# Patient Record
Sex: Female | Born: 1975 | Race: White | Hispanic: No | Marital: Married | Smoking: Never smoker
Health system: Southern US, Community
[De-identification: ages and names within clinical notes are randomized; demographics above are authoritative.]

## PROBLEM LIST (undated history)

## (undated) DIAGNOSIS — Z8489 Family history of other specified conditions: Secondary | ICD-10-CM

## (undated) DIAGNOSIS — C801 Malignant (primary) neoplasm, unspecified: Secondary | ICD-10-CM

## (undated) DIAGNOSIS — G43909 Migraine, unspecified, not intractable, without status migrainosus: Secondary | ICD-10-CM

## (undated) DIAGNOSIS — N94810 Vulvar vestibulitis: Secondary | ICD-10-CM

## (undated) DIAGNOSIS — R011 Cardiac murmur, unspecified: Secondary | ICD-10-CM

## (undated) DIAGNOSIS — IMO0001 Reserved for inherently not codable concepts without codable children: Secondary | ICD-10-CM

## (undated) DIAGNOSIS — Z9889 Other specified postprocedural states: Secondary | ICD-10-CM

## (undated) DIAGNOSIS — N761 Subacute and chronic vaginitis: Secondary | ICD-10-CM

## (undated) DIAGNOSIS — R112 Nausea with vomiting, unspecified: Secondary | ICD-10-CM

## (undated) DIAGNOSIS — Z923 Personal history of irradiation: Secondary | ICD-10-CM

## (undated) DIAGNOSIS — IMO0002 Reserved for concepts with insufficient information to code with codable children: Secondary | ICD-10-CM

## (undated) HISTORY — PX: WISDOM TOOTH EXTRACTION: SHX21

## (undated) HISTORY — DX: Reserved for concepts with insufficient information to code with codable children: IMO0002

## (undated) HISTORY — DX: Reserved for inherently not codable concepts without codable children: IMO0001

## (undated) HISTORY — DX: Subacute and chronic vaginitis: N76.1

## (undated) HISTORY — PX: DILATION AND CURETTAGE OF UTERUS: SHX78

## (undated) HISTORY — PX: REDUCTION MAMMAPLASTY: SUR839

## (undated) HISTORY — DX: Vulvar vestibulitis: N94.810

## (undated) HISTORY — DX: Migraine, unspecified, not intractable, without status migrainosus: G43.909

---

## 1998-10-04 ENCOUNTER — Other Ambulatory Visit: Admission: RE | Admit: 1998-10-04 | Discharge: 1998-10-04 | Payer: Self-pay | Admitting: Obstetrics and Gynecology

## 1999-10-10 ENCOUNTER — Other Ambulatory Visit: Admission: RE | Admit: 1999-10-10 | Discharge: 1999-10-10 | Payer: Self-pay | Admitting: Obstetrics and Gynecology

## 2000-10-03 ENCOUNTER — Other Ambulatory Visit: Admission: RE | Admit: 2000-10-03 | Discharge: 2000-10-03 | Payer: Self-pay | Admitting: Obstetrics and Gynecology

## 2001-10-31 ENCOUNTER — Other Ambulatory Visit: Admission: RE | Admit: 2001-10-31 | Discharge: 2001-10-31 | Payer: Self-pay | Admitting: Obstetrics and Gynecology

## 2002-10-22 ENCOUNTER — Ambulatory Visit (HOSPITAL_COMMUNITY): Admission: RE | Admit: 2002-10-22 | Discharge: 2002-10-22 | Payer: Self-pay | Admitting: Obstetrics and Gynecology

## 2002-10-22 ENCOUNTER — Encounter: Payer: Self-pay | Admitting: Obstetrics and Gynecology

## 2002-11-03 ENCOUNTER — Other Ambulatory Visit: Admission: RE | Admit: 2002-11-03 | Discharge: 2002-11-03 | Payer: Self-pay | Admitting: Obstetrics and Gynecology

## 2003-01-10 ENCOUNTER — Ambulatory Visit (HOSPITAL_COMMUNITY): Admission: RE | Admit: 2003-01-10 | Discharge: 2003-01-10 | Payer: Self-pay | Admitting: Obstetrics and Gynecology

## 2004-03-24 ENCOUNTER — Inpatient Hospital Stay (HOSPITAL_COMMUNITY): Admission: AD | Admit: 2004-03-24 | Discharge: 2004-03-26 | Payer: Self-pay | Admitting: Obstetrics and Gynecology

## 2004-05-05 ENCOUNTER — Other Ambulatory Visit: Admission: RE | Admit: 2004-05-05 | Discharge: 2004-05-05 | Payer: Self-pay | Admitting: Obstetrics and Gynecology

## 2005-05-08 ENCOUNTER — Other Ambulatory Visit: Admission: RE | Admit: 2005-05-08 | Discharge: 2005-05-08 | Payer: Self-pay | Admitting: Obstetrics and Gynecology

## 2006-10-07 ENCOUNTER — Inpatient Hospital Stay (HOSPITAL_COMMUNITY): Admission: AD | Admit: 2006-10-07 | Discharge: 2006-10-07 | Payer: Self-pay | Admitting: Obstetrics and Gynecology

## 2006-10-12 ENCOUNTER — Inpatient Hospital Stay (HOSPITAL_COMMUNITY): Admission: RE | Admit: 2006-10-12 | Discharge: 2006-10-14 | Payer: Self-pay | Admitting: Obstetrics and Gynecology

## 2011-10-26 ENCOUNTER — Other Ambulatory Visit: Payer: Self-pay | Admitting: Internal Medicine

## 2011-10-26 DIAGNOSIS — R42 Dizziness and giddiness: Secondary | ICD-10-CM

## 2011-10-26 DIAGNOSIS — R51 Headache: Secondary | ICD-10-CM

## 2011-10-30 ENCOUNTER — Ambulatory Visit
Admission: RE | Admit: 2011-10-30 | Discharge: 2011-10-30 | Disposition: A | Discharge status: Home / Self Care | Payer: BC Managed Care – PPO | Source: Ambulatory Visit | Attending: Internal Medicine | Admitting: Internal Medicine

## 2011-10-30 DIAGNOSIS — R42 Dizziness and giddiness: Secondary | ICD-10-CM

## 2011-10-30 DIAGNOSIS — R51 Headache: Secondary | ICD-10-CM

## 2011-10-30 MED ORDER — GADOBENATE DIMEGLUMINE 529 MG/ML IV SOLN
14.0000 mL | Freq: Once | INTRAVENOUS | Status: AC | PRN
  Administered 2011-10-30: 14 mL via INTRAVENOUS

## 2012-02-29 ENCOUNTER — Encounter: Payer: Self-pay | Admitting: Obstetrics and Gynecology

## 2012-02-29 NOTE — Progress Notes (Signed)
PT WALKED IN REQUESTING A SAMPLE OF NUVARING.  HAS  NOT MISSED ANY RINGS, AEX SCHED FOR WED. 04/10/12 W/ ND @ 0900.

## 2012-04-10 ENCOUNTER — Ambulatory Visit (INDEPENDENT_AMBULATORY_CARE_PROVIDER_SITE_OTHER): Payer: BC Managed Care – PPO | Admitting: Obstetrics and Gynecology

## 2012-04-10 VITALS — BP 108/72 | Ht 69.0 in | Wt 156.0 lb

## 2012-04-10 DIAGNOSIS — Z01419 Encounter for gynecological examination (general) (routine) without abnormal findings: Secondary | ICD-10-CM

## 2012-04-10 DIAGNOSIS — Z3040 Encounter for surveillance of contraceptives, unspecified: Secondary | ICD-10-CM

## 2012-04-10 DIAGNOSIS — Z124 Encounter for screening for malignant neoplasm of cervix: Secondary | ICD-10-CM

## 2012-04-11 LAB — PAP IG W/ RFLX HPV ASCU

## 2012-04-11 NOTE — Progress Notes (Signed)
Last Pap: 03-14-11 WNL: Yes Regular Periods:no Contraception: nuvaring using continually Rx and samples given  Monthly Breast exam:no Tetanus<58yrs:yes Nl.Bladder Function:yes Daily BMs:yes Healthy Diet:no Calcium:no Mammogram:no Date of Mammogram: no Exercise:yes Have often Exercise: 2-3 x week Seatbelt: yes Abuse at home: no Stressful work:no Sigmoid-colonoscopy: no Bone Density: No PCP: Dr Darcus Austin Change in PMH: no Change in FMH:no MRI done for migraines in Feb WNL Next pap 2015

## 2012-08-13 ENCOUNTER — Telehealth: Payer: Self-pay

## 2012-08-13 NOTE — Telephone Encounter (Signed)
Per protocol, I called in refills of Nuvaring to [facility] for pt to use continuously, enough to last til 04/2013. Pt is current with her AEX. Melody Comas A

## 2013-06-17 ENCOUNTER — Other Ambulatory Visit: Payer: Self-pay | Admitting: Physician Assistant

## 2014-09-11 HISTORY — PX: MASTECTOMY: SHX3

## 2014-10-20 ENCOUNTER — Other Ambulatory Visit: Payer: Self-pay

## 2014-10-20 DIAGNOSIS — Z1231 Encounter for screening mammogram for malignant neoplasm of breast: Secondary | ICD-10-CM

## 2014-10-29 ENCOUNTER — Ambulatory Visit
Admission: RE | Admit: 2014-10-29 | Discharge: 2014-10-29 | Disposition: A | Discharge status: Home / Self Care | Payer: BLUE CROSS/BLUE SHIELD | Source: Ambulatory Visit

## 2014-10-29 DIAGNOSIS — Z1231 Encounter for screening mammogram for malignant neoplasm of breast: Secondary | ICD-10-CM

## 2014-10-30 ENCOUNTER — Other Ambulatory Visit: Payer: Self-pay | Admitting: Obstetrics and Gynecology

## 2014-10-30 DIAGNOSIS — N644 Mastodynia: Secondary | ICD-10-CM

## 2014-10-30 DIAGNOSIS — N63 Unspecified lump in unspecified breast: Secondary | ICD-10-CM

## 2014-11-10 ENCOUNTER — Ambulatory Visit
Admission: RE | Admit: 2014-11-10 | Discharge: 2014-11-10 | Disposition: A | Discharge status: Home / Self Care | Payer: BLUE CROSS/BLUE SHIELD | Source: Ambulatory Visit | Attending: Obstetrics and Gynecology | Admitting: Obstetrics and Gynecology

## 2014-11-10 ENCOUNTER — Other Ambulatory Visit: Payer: Self-pay | Admitting: Obstetrics and Gynecology

## 2014-11-10 DIAGNOSIS — N63 Unspecified lump in unspecified breast: Secondary | ICD-10-CM

## 2014-11-10 DIAGNOSIS — N644 Mastodynia: Secondary | ICD-10-CM

## 2014-11-11 ENCOUNTER — Other Ambulatory Visit: Payer: Self-pay | Admitting: Obstetrics and Gynecology

## 2014-11-11 ENCOUNTER — Ambulatory Visit
Admission: RE | Admit: 2014-11-11 | Discharge: 2014-11-11 | Disposition: A | Discharge status: Home / Self Care | Payer: BLUE CROSS/BLUE SHIELD | Source: Ambulatory Visit | Attending: Obstetrics and Gynecology | Admitting: Obstetrics and Gynecology

## 2014-11-11 DIAGNOSIS — N63 Unspecified lump in unspecified breast: Secondary | ICD-10-CM

## 2014-11-11 DIAGNOSIS — C50911 Malignant neoplasm of unspecified site of right female breast: Secondary | ICD-10-CM

## 2014-11-11 DIAGNOSIS — N644 Mastodynia: Secondary | ICD-10-CM

## 2014-11-17 ENCOUNTER — Other Ambulatory Visit: Payer: BLUE CROSS/BLUE SHIELD

## 2014-11-17 ENCOUNTER — Telehealth: Payer: Self-pay | Admitting: *Deleted

## 2014-11-17 NOTE — Telephone Encounter (Signed)
Received call from pt that her MRI was cancelled and she was not informed. Called [facility] to question event. Per [facility], Dr. Berneta Sages office had notified MRI was being cancelled b/c they did not get an authorization. New appt for breast MRI is scheduled for 11/19/14 at 9:15. Confirmed appt with pt. Informed pt that Dr. Berneta Sages office is doing a peer to peer to received authorization. Received verbal understand. Encourage pt to call with needs.

## 2014-11-19 ENCOUNTER — Ambulatory Visit
Admission: RE | Admit: 2014-11-19 | Discharge: 2014-11-19 | Disposition: A | Discharge status: Home / Self Care | Payer: BLUE CROSS/BLUE SHIELD | Source: Ambulatory Visit | Attending: Obstetrics and Gynecology | Admitting: Obstetrics and Gynecology

## 2014-11-19 DIAGNOSIS — C50911 Malignant neoplasm of unspecified site of right female breast: Secondary | ICD-10-CM

## 2014-11-19 MED ORDER — GADOBENATE DIMEGLUMINE 529 MG/ML IV SOLN
15.0000 mL | Freq: Once | INTRAVENOUS | Status: AC | PRN
  Administered 2014-11-19: 15 mL via INTRAVENOUS

## 2014-11-20 ENCOUNTER — Telehealth: Payer: Self-pay | Admitting: *Deleted

## 2014-11-20 ENCOUNTER — Other Ambulatory Visit: Payer: Self-pay | Admitting: General Surgery

## 2014-11-20 ENCOUNTER — Other Ambulatory Visit: Payer: Self-pay | Admitting: *Deleted

## 2014-11-20 DIAGNOSIS — D0510 Intraductal carcinoma in situ of unspecified breast: Secondary | ICD-10-CM | POA: Insufficient documentation

## 2014-11-20 DIAGNOSIS — R928 Other abnormal and inconclusive findings on diagnostic imaging of breast: Secondary | ICD-10-CM

## 2014-11-20 NOTE — Telephone Encounter (Signed)
Scheduled and confirmed new pt appt with Dr. Jana Hakim on 11/23/14 at 4:30 and genetics on 11/26/14 at 3:30PM Pt denies need at this time. Encourage pt to call with questions or concerns.  Received verbal understanding. Contact information given.

## 2014-11-23 ENCOUNTER — Ambulatory Visit: Payer: BLUE CROSS/BLUE SHIELD

## 2014-11-23 ENCOUNTER — Other Ambulatory Visit (HOSPITAL_BASED_OUTPATIENT_CLINIC_OR_DEPARTMENT_OTHER): Payer: BLUE CROSS/BLUE SHIELD

## 2014-11-23 ENCOUNTER — Encounter: Payer: Self-pay | Admitting: Oncology

## 2014-11-23 ENCOUNTER — Ambulatory Visit (HOSPITAL_BASED_OUTPATIENT_CLINIC_OR_DEPARTMENT_OTHER): Payer: BLUE CROSS/BLUE SHIELD | Admitting: Oncology

## 2014-11-23 DIAGNOSIS — G43009 Migraine without aura, not intractable, without status migrainosus: Secondary | ICD-10-CM

## 2014-11-23 DIAGNOSIS — D0500 Lobular carcinoma in situ of unspecified breast: Secondary | ICD-10-CM

## 2014-11-23 DIAGNOSIS — Z170 Estrogen receptor positive status [ER+]: Secondary | ICD-10-CM

## 2014-11-23 DIAGNOSIS — D0511 Intraductal carcinoma in situ of right breast: Secondary | ICD-10-CM

## 2014-11-23 DIAGNOSIS — D0510 Intraductal carcinoma in situ of unspecified breast: Secondary | ICD-10-CM

## 2014-11-23 DIAGNOSIS — G43909 Migraine, unspecified, not intractable, without status migrainosus: Secondary | ICD-10-CM | POA: Insufficient documentation

## 2014-11-23 DIAGNOSIS — C50411 Malignant neoplasm of upper-outer quadrant of right female breast: Secondary | ICD-10-CM | POA: Insufficient documentation

## 2014-11-23 LAB — CBC WITH DIFFERENTIAL/PLATELET
BASO%: 0.7 % (ref 0.0–2.0)
BASOS ABS: 0 10*3/uL (ref 0.0–0.1)
EOS ABS: 0.1 10*3/uL (ref 0.0–0.5)
EOS%: 0.8 % (ref 0.0–7.0)
HEMATOCRIT: 36.9 % (ref 34.8–46.6)
HEMOGLOBIN: 12.7 g/dL (ref 11.6–15.9)
LYMPH#: 2.6 10*3/uL (ref 0.9–3.3)
LYMPH%: 43.2 % (ref 14.0–49.7)
MCH: 31.8 pg (ref 25.1–34.0)
MCHC: 34.4 g/dL (ref 31.5–36.0)
MCV: 92.5 fL (ref 79.5–101.0)
MONO#: 0.4 10*3/uL (ref 0.1–0.9)
MONO%: 5.9 % (ref 0.0–14.0)
NEUT%: 49.4 % (ref 38.4–76.8)
NEUTROS ABS: 3 10*3/uL (ref 1.5–6.5)
Platelets: 229 10*3/uL (ref 145–400)
RBC: 3.99 10*6/uL (ref 3.70–5.45)
RDW: 12 % (ref 11.2–14.5)
WBC: 6.1 10*3/uL (ref 3.9–10.3)

## 2014-11-23 LAB — COMPREHENSIVE METABOLIC PANEL (CC13)
ALBUMIN: 4 g/dL (ref 3.5–5.0)
ALK PHOS: 65 U/L (ref 40–150)
ALT: 13 U/L (ref 0–55)
ANION GAP: 8 meq/L (ref 3–11)
AST: 14 U/L (ref 5–34)
BUN: 9.7 mg/dL (ref 7.0–26.0)
CALCIUM: 9.7 mg/dL (ref 8.4–10.4)
CO2: 27 meq/L (ref 22–29)
Chloride: 106 mEq/L (ref 98–109)
Creatinine: 0.9 mg/dL (ref 0.6–1.1)
EGFR: 78 mL/min/{1.73_m2} — AB (ref >90)
GLUCOSE: 115 mg/dL (ref 70–140)
Potassium: 3.8 mEq/L (ref 3.5–5.1)
SODIUM: 141 meq/L (ref 136–145)
TOTAL PROTEIN: 7.1 g/dL (ref 6.4–8.3)
Total Bilirubin: 0.3 mg/dL (ref 0.20–1.20)

## 2014-11-23 MED ORDER — TAMOXIFEN CITRATE 20 MG PO TABS: 20.0000 mg | ORAL_TABLET | Freq: Every day | ORAL | Status: DC

## 2014-11-23 NOTE — Addendum Note (Signed)
Addended by: Chauncey Cruel on: 11/23/2014 05:52 PM   Modules accepted: Orders, Medications
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
## 2014-11-23 NOTE — Progress Notes (Signed)
Checked in new pt with no financial concerns prior to seeing the dr.  Informed pt if chemo is part of her treatment Raquel will contact her ins to see if Josem Kaufmann is req and will obtain it if it is as well as contact foundations that offer copay assistance for chemo is needed.  She has Raquel's card for any billing questions or concerns.

## 2014-11-24 ENCOUNTER — Other Ambulatory Visit: Payer: Self-pay | Admitting: *Deleted

## 2014-11-24 MED ORDER — TAMOXIFEN CITRATE 20 MG PO TABS: 20.0000 mg | ORAL_TABLET | Freq: Every day | ORAL | Status: DC

## 2014-11-26 ENCOUNTER — Other Ambulatory Visit: Payer: BLUE CROSS/BLUE SHIELD

## 2014-11-26 ENCOUNTER — Ambulatory Visit (HOSPITAL_BASED_OUTPATIENT_CLINIC_OR_DEPARTMENT_OTHER): Payer: BLUE CROSS/BLUE SHIELD | Admitting: Genetic Counselor

## 2014-11-26 DIAGNOSIS — C50919 Malignant neoplasm of unspecified site of unspecified female breast: Secondary | ICD-10-CM | POA: Insufficient documentation

## 2014-11-26 DIAGNOSIS — D0511 Intraductal carcinoma in situ of right breast: Secondary | ICD-10-CM

## 2014-11-26 DIAGNOSIS — Z808 Family history of malignant neoplasm of other organs or systems: Secondary | ICD-10-CM

## 2014-11-26 DIAGNOSIS — C50911 Malignant neoplasm of unspecified site of right female breast: Secondary | ICD-10-CM

## 2014-11-26 DIAGNOSIS — Z315 Encounter for genetic counseling: Secondary | ICD-10-CM

## 2014-11-26 DIAGNOSIS — Z800 Family history of malignant neoplasm of digestive organs: Secondary | ICD-10-CM | POA: Insufficient documentation

## 2014-11-26 NOTE — Progress Notes (Signed)
[facility] Patient Visit  REFERRING PROVIDER: Crist Infante, MD [address] [place], [place] [address]  PRIMARY PROVIDER:  Jerlyn Ly, MD  PRIMARY REASON FOR VISIT:  Personal history of breast cancer  HISTORY OF PRESENT ILLNESS:   Loretta Sanchez, a 39 y.o. female, was seen for a [facility] cancer genetics consultation at the request of Dr. Joylene Draft due to a personal and family history of cancer.  Loretta Sanchez presents to clinic today to discuss the possibility of a hereditary predisposition to cancer, genetic testing, and to further clarify her future cancer risks, as well as potential cancer risks for family members. Loretta Sanchez was accompanied to clinic by her mother, Juliann Pulse.  CANCER HISTORY:  Ms. 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 She is currently in the planning stages of surgery and treatment and would like to use genetic test results to help make a surgical decision. She has no prior history of cancer.   Past Medical History  Diagnosis Date   Chronic vaginitis    Vulvar vestibulitis    Migraines     Past Surgical History  Procedure Laterality Date   Wisdom tooth extraction      History   Social History   Marital Status: Married    Spouse Name: N/A   Number of Children: N/A   Years of Education: N/A   Social History Main Topics   Smoking status: Not on file   Smokeless tobacco: Not on file   Alcohol Use: Not on file   Drug Use: Not on file   Sexual Activity: Not on file   Other Topics Concern   Not on file   Social History Narrative     FAMILY HISTORY:  During the visit, a 4-generation pedigree was obtained. A copy of the pedigree with be scanned into Epic under the Media tab. Significant family history diagnoses include the following: Family History  Problem Relation Age of Onset   Hypertension Mother    Hypertension Father    Hypertension Maternal Grandmother     Cancer Maternal Grandmother 81    primary stomach cancer   Hypertension Maternal Grandfather    Hypertension Paternal Grandmother    Hypertension Paternal Grandfather    Diabetes Paternal Grandfather    Cancer Maternal Uncle 89    primary bone cancer   Cancer Other 17    mat great aunt (MGM sister) with primary stomach cancer   Ms. Blane's ancestry is of Caucasian descent. There is no known Jewish ancestry or consanguinity.  GENETIC COUNSELING ASSESSMENT:  Ms. Harron is a 39 y.o. female with a personal and family history of cancer suggestive of a hereditary predisposition to cancer. We, therefore, discussed and recommended the following at today's visit.   DISCUSSION:  We reviewed the characteristics, features and inheritance patterns of hereditary cancer syndromes. We also discussed genetic testing, including the appropriate family members to test, the process of testing, insurance coverage and turn-around-time for results. We discussed the implications of a negative, positive and/or variant of uncertain significant result. We recommended Ms. Gaspar Garbe pursue genetic testing for the BRCAplus gene panel offered by [facility] first followed by the OvaNext gene panel. The BRCAplus genes panel includes sequencing and rearrangement analysis of the following highly penetrant hereditary cancer genes which can help individuals in breast cancer surgical planning: BRCA1, BRCA2, PALB2, PTEN, TP53 and CDH1. The OvaNext gene panel offered by [facility] includes sequencing and rearrangement analysis  for the following 24 genes (6 of which are tested via the BRCAplus gene panel) :ATM, BARD1, BRCA1, BRCA2, BRIP1, CDH1, CHEK2, EPCAM, MLH1, MRE11A, MSH2, MSH6, MUTYH, NBN, NF1, PALB2, PMS2, PTEN, RAD50, RAD51C, RAD51D, SMARCA4, STK11, and TP53.  PLAN:  Based on our above recommendation, Ms. Goga wished to pursue genetic testing and the blood sample was drawn and will be sent to [facility] for analysis. Results should be available for the BRCAplus gene panel within approximately 2 weeks time, at which point they will be disclosed by telephone to Ms. Wolff, as will any additional recommendations warranted by these results. Reflex testing to the OvaNext gene panel will take an additional 2 weeks and these results and recommendations will also be called out once available.   Based on Ms. McCalls family history of stomach cancer on the maternal side of the family, we also recommended her mother consider genetic counseling and testing and are happy to be of any assistance in coordinating genetic counseling and/or testing for her mother. Lastly, we encouraged Ms. [facility] to remain in contact with cancer genetics annually so that we can continuously update the family history and inform her of any changes in cancer genetics and testing that may be of benefit for this family.   Ms.  Graffius questions were answered to her satisfaction today. Our contact information was provided should additional questions or concerns arise. Thank you for the referral and allowing [place] to share in the care of your patient.   Catherine A. Fine, MS, CGC Certified Psychologist, sport [email or web address] phone: [phone]  The patient was seen for a total of 45 minutes in face-to-face genetic counseling.  This patient was discussed with Dr. Jana Hakim who agrees with the above.    ______________________________________________________________________ For Office Staff:  Number of people involved in session including genetic counselor: 3 Was an intern or student involved with case: not applicable

## 2014-11-27 ENCOUNTER — Other Ambulatory Visit: Payer: BLUE CROSS/BLUE SHIELD

## 2014-11-27 ENCOUNTER — Telehealth: Payer: Self-pay | Admitting: *Deleted

## 2014-11-27 NOTE — Telephone Encounter (Signed)
This RN returned call to pt per her call placed in VM of Loretta Sanchez. Message was inquiring about " what type of birth control did he want me to ask my GYN about "  This RN reviewed pt's dictation and left information of " mirena" on identified VM.

## 2014-11-30 ENCOUNTER — Other Ambulatory Visit: Payer: Self-pay | Admitting: General Surgery

## 2014-11-30 ENCOUNTER — Telehealth: Payer: Self-pay | Admitting: *Deleted

## 2014-11-30 DIAGNOSIS — D0511 Intraductal carcinoma in situ of right breast: Secondary | ICD-10-CM

## 2014-11-30 NOTE — Telephone Encounter (Signed)
Pt called asking which IUD to use- Mirena or Paragard. Recommend Paragard d/t no hormone in this IUD. Received verbal understanding.

## 2014-12-09 ENCOUNTER — Encounter: Payer: Self-pay | Admitting: Genetic Counselor

## 2014-12-09 DIAGNOSIS — Z800 Family history of malignant neoplasm of digestive organs: Secondary | ICD-10-CM

## 2014-12-09 DIAGNOSIS — C50911 Malignant neoplasm of unspecified site of right female breast: Secondary | ICD-10-CM

## 2014-12-09 NOTE — Progress Notes (Signed)
Loretta Sanchez recently had cancer genetic counseling at [facility] on 11/26/2014. At that time, it was recommended she pursue genetic testing for the BRCAPlus gene panel at [facility], which looks for mutations in the BRCA1, BRCA2, PALB2, CDH1, PTEN and TP53 genes . Her gene panel, which was performed at [facility], has returned and is negative for mutations in these 6 genes. These results were disclosed to her today. Per her request, reflex testing for the OvaNext gene panel at [facility] was initiated. The OvaNext gene panel offered by [employer] includes sequencing and rearrangement analysis for the following 24 genes (6 of which have been completed as mentioned) :ATM, BARD1, BRCA1, BRCA2, BRIP1, CDH1, CHEK2, EPCAM, MLH1, MRE11A, MSH2, MSH6, MUTYH, NBN, NF1, PALB2, PMS2, PTEN, RAD50, RAD51C, RAD51D, SMARCA4, STK11, and TP53. Results for the OvaNext gene panel should be available in 2-3 more weeks and we will contact her to discuss these results and recommendations warranted by these results.

## 2014-12-10 ENCOUNTER — Other Ambulatory Visit (HOSPITAL_COMMUNITY): Payer: Self-pay | Admitting: *Deleted

## 2014-12-10 ENCOUNTER — Other Ambulatory Visit (HOSPITAL_COMMUNITY): Payer: BLUE CROSS/BLUE SHIELD

## 2014-12-10 ENCOUNTER — Encounter (HOSPITAL_COMMUNITY): Payer: Self-pay

## 2014-12-10 ENCOUNTER — Encounter (HOSPITAL_COMMUNITY)
Admission: RE | Admit: 2014-12-10 | Discharge: 2014-12-10 | Disposition: A | Discharge status: Home / Self Care | Payer: BLUE CROSS/BLUE SHIELD | Source: Ambulatory Visit | Attending: General Surgery | Admitting: General Surgery

## 2014-12-10 DIAGNOSIS — Z01812 Encounter for preprocedural laboratory examination: Secondary | ICD-10-CM | POA: Insufficient documentation

## 2014-12-10 HISTORY — DX: Malignant (primary) neoplasm, unspecified: C80.1

## 2014-12-10 HISTORY — DX: Cardiac murmur, unspecified: R01.1

## 2014-12-10 HISTORY — DX: Family history of other specified conditions: Z84.89

## 2014-12-10 LAB — CBC WITH DIFFERENTIAL/PLATELET
BASOS ABS: 0 10*3/uL (ref 0.0–0.1)
BASOS PCT: 0 % (ref 0–1)
Eosinophils Absolute: 0.1 10*3/uL (ref 0.0–0.7)
Eosinophils Relative: 1 % (ref 0–5)
HEMATOCRIT: 37.2 % (ref 36.0–46.0)
Hemoglobin: 12.7 g/dL (ref 12.0–15.0)
Lymphocytes Relative: 35 % (ref 12–46)
Lymphs Abs: 2 10*3/uL (ref 0.7–4.0)
MCH: 31.7 pg (ref 26.0–34.0)
MCHC: 34.1 g/dL (ref 30.0–36.0)
MCV: 92.8 fL (ref 78.0–100.0)
MONOS PCT: 7 % (ref 3–12)
Monocytes Absolute: 0.4 10*3/uL (ref 0.1–1.0)
NEUTROS ABS: 3.3 10*3/uL (ref 1.7–7.7)
Neutrophils Relative %: 57 % (ref 43–77)
Platelets: 215 10*3/uL (ref 150–400)
RBC: 4.01 MIL/uL (ref 3.87–5.11)
RDW: 12.1 % (ref 11.5–15.5)
WBC: 5.8 10*3/uL (ref 4.0–10.5)

## 2014-12-10 LAB — BASIC METABOLIC PANEL
ANION GAP: 8 (ref 5–15)
BUN: 9 mg/dL (ref 6–23)
CHLORIDE: 104 mmol/L (ref 96–112)
CO2: 27 mmol/L (ref 19–32)
Calcium: 9.6 mg/dL (ref 8.4–10.5)
Creatinine, Ser: 0.89 mg/dL (ref 0.50–1.10)
GFR calc non Af Amer: 79 mL/min — ABNORMAL LOW (ref >90)
Glucose, Bld: 114 mg/dL — ABNORMAL HIGH (ref 70–99)
Potassium: 3.8 mmol/L (ref 3.5–5.1)
SODIUM: 139 mmol/L (ref 135–145)

## 2014-12-10 NOTE — Pre-Procedure Instructions (Signed)
Loretta Sanchez  12/10/2014   Your procedure is scheduled on:  Monday, December 21, 2014 at 7:30 AM.   Report to [facility] Entrance "A" Admitting Office at 5:30 AM.   Call this number if you have problems the morning of surgery: [phone]               Any questions prior to day of surgery, please call [phone] between 8 & 4 PM.    Remember:   Do not eat food or drink liquids after midnight Sunday, 12/20/14.   Take these medicines the morning of surgery with A SIP OF WATER: Tamoxifen (Nolvadex), Cetirizine (Zyrtec) - if needed.  Stop Vitamins 7 days prior to surgery.   Do not wear jewelry, make-up or nail polish.  Do not wear lotions, powders, or perfumes. You may NOT wear deodorant.  Do not shave 48 hours prior to surgery.   Do not bring valuables to the hospital.  [facility] is not responsible                  for any belongings or valuables.               Contacts, dentures or bridgework may not be worn into surgery.  Leave suitcase in the car. After surgery it may be brought to your room.  For patients admitted to the hospital, discharge time is determined by your                treatment team.               Special Instructions: [name] - Preparing for Surgery  Before surgery, you can play an important role.  Because skin is not sterile, your skin needs to be as free of germs as possible.  You can reduce the number of germs on you skin by washing with CHG (chlorahexidine gluconate) soap before surgery.  CHG is an antiseptic cleaner which kills germs and bonds with the skin to continue killing germs even after washing.  Please DO NOT use if you have an allergy to CHG or antibacterial soaps.  If your skin becomes reddened/irritated stop using the CHG and inform your nurse when you arrive at Short Stay.  Do not shave (including legs and underarms) for at least 48 hours prior to the first CHG shower.  You may shave your face.  Please follow these instructions  carefully:   1.  Shower with CHG Soap the night before surgery and the                                morning of Surgery.  2.  If you choose to wash your hair, wash your hair first as usual with your       normal shampoo.  3.  After you shampoo, rinse your hair and body thoroughly to remove the                      Shampoo.  4.  Use CHG as you would any other liquid soap.  You can apply chg directly       to the skin and wash gently with scrungie or a clean washcloth.  5.  Apply the CHG Soap to your body ONLY FROM THE NECK DOWN.        Do not use on open wounds or open sores.  Avoid contact with your  eyes, ears, mouth and genitals (private parts).  Wash genitals (private parts) with your normal soap.  6.  Wash thoroughly, paying special attention to the area where your surgery        will be performed.  7.  Thoroughly rinse your body with warm water from the neck down.  8.  DO NOT shower/wash with your normal soap after using and rinsing off       the CHG Soap.  9.  Pat yourself dry with a clean towel.            10.  Wear clean pajamas.            11.  Place clean sheets on your bed the night of your first shower and do not        sleep with pets.  Day of Surgery  Do not apply any lotions/deodorants the morning of surgery.  Please wear clean clothes to the hospital.    Please read over the following fact sheets that you were given: Pain Booklet, Coughing and Deep Breathing and Surgical Site Infection Prevention

## 2014-12-10 NOTE — Progress Notes (Signed)
Pt stated that Dr. Iran Planas is doing breast reconstruction after Dr. Donne Hazel does the mastectomy. No orders in EPIC from Dr. Iran Planas. Called Dr. Para Skeans office to request orders, left message on nurse line.

## 2014-12-17 NOTE — H&P (Deleted)
  Subjective:    Patient ID: Loretta Sanchez is a 39 y.o. female.  HPI  Returns for follow up discussion breast reconstruction; plan for mastectomy with immediate expander/ADM. Presented with pain in the UOQ of right breast and had a brownish discharge from the right nipple. Underwent bilateral diagnostic MMG and right breast US. The breast density was category C. In the 9 o'clock area of the right breast there were numerous microcalcifications, extending over an area of 7 cm. There were calcifications in the retroareolar area as well in the right breast. US showed focally dilated ducts in the 9:00 position of the breast. The dilated ducts contained some internal echogenic soft tissues and spanned at least 7 cm. Ultrasound of the right axilla was unremarkable as was the left breast. 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 MRI showed an area of confluent non-masslike enhancement throughout the outer right breast extending over 9 cm. There were no abnormal appearing lymph nodes, left breast was unremarkable. Genetics panel negative so far. Has started tamoxifen as she awaits surgery.  Current 38 B, happy with this. Wt stable. Reports still has pain on right.  Review of Systems 12 point review negative    Objective:    Physical Exam  Cardiovascular: Normal rate, regular rhythm and normal heart sounds.  Pulmonary/Chest: Effort normal and breath sounds normal.  Genitourinary: No breast discharge.  Grade 1 ptosis on left, pseudoptosis on right SN to nipple R 24 L 25 cm BW R 16 L 15 Nipple to IMF R 8 L 9 cm  Induration post biopsy right  Skin:  Rodney Booze 2       Assessment:      R breast DCIS     Plan:      Plan immediate expander placement. She has been counseled not a candidate for NSM given imaging with disease in area of areola. Reviewed process of expansion, timing of secondary and symmetry surgeries. Discussed use of acellular dermal  matrix. Discussed implant based risks inc rupture, infection, extrusion, rotation, contracture. Reviewed hospital stay, incisions, drains, post procedure limitations. Discussed risks including but not limited to bleeding, infection, seroma, hematoma, need for additional surgery, damage to deeper structures, DVT/PE, cardiopulmonary complications, unacceptable cosmetic result.    15 minutes spent with pt over half in counseling.      Irene Limbo, MD [facility] Plastic & Reconstructive Surgery [phone]

## 2014-12-20 MED ORDER — CEFAZOLIN SODIUM-DEXTROSE 2-3 GM-% IV SOLR
2.0000 g | INTRAVENOUS | Status: AC
  Administered 2014-12-21: 2 g via INTRAVENOUS
  Filled 2014-12-20: qty 50

## 2014-12-21 ENCOUNTER — Encounter (HOSPITAL_COMMUNITY)
Admission: RE | Admit: 2014-12-21 | Discharge: 2014-12-21 | Disposition: A | Discharge status: Home / Self Care | Payer: BLUE CROSS/BLUE SHIELD | Source: Ambulatory Visit | Attending: General Surgery | Admitting: General Surgery

## 2014-12-21 ENCOUNTER — Ambulatory Visit (HOSPITAL_COMMUNITY): Payer: BLUE CROSS/BLUE SHIELD | Admitting: Anesthesiology

## 2014-12-21 ENCOUNTER — Encounter (HOSPITAL_COMMUNITY)
Admission: RE | Disposition: A | Discharge status: Home / Self Care | Payer: Self-pay | Source: Ambulatory Visit | Attending: General Surgery

## 2014-12-21 ENCOUNTER — Observation Stay (HOSPITAL_COMMUNITY)
Admission: RE | Admit: 2014-12-21 | Discharge: 2014-12-22 | Disposition: A | Discharge status: Home / Self Care | Payer: BLUE CROSS/BLUE SHIELD | Source: Ambulatory Visit | Attending: General Surgery | Admitting: General Surgery

## 2014-12-21 ENCOUNTER — Encounter (HOSPITAL_COMMUNITY): Payer: Self-pay | Admitting: *Deleted

## 2014-12-21 DIAGNOSIS — D0510 Intraductal carcinoma in situ of unspecified breast: Secondary | ICD-10-CM | POA: Diagnosis present

## 2014-12-21 DIAGNOSIS — C50911 Malignant neoplasm of unspecified site of right female breast: Secondary | ICD-10-CM | POA: Diagnosis not present

## 2014-12-21 DIAGNOSIS — D0511 Intraductal carcinoma in situ of right breast: Secondary | ICD-10-CM

## 2014-12-21 DIAGNOSIS — G43909 Migraine, unspecified, not intractable, without status migrainosus: Secondary | ICD-10-CM | POA: Diagnosis not present

## 2014-12-21 HISTORY — PX: SIMPLE MASTECTOMY WITH AXILLARY SENTINEL NODE BIOPSY: SHX6098

## 2014-12-21 HISTORY — PX: BREAST RECONSTRUCTION WITH PLACEMENT OF TISSUE EXPANDER AND FLEX HD (ACELLULAR HYDRATED DERMIS): SHX6295

## 2014-12-21 SURGERY — SIMPLE MASTECTOMY WITH AXILLARY SENTINEL NODE BIOPSY
Anesthesia: Regional | Site: Breast | Laterality: Right

## 2014-12-21 MED ORDER — PROMETHAZINE HCL 25 MG/ML IJ SOLN
INTRAMUSCULAR | Status: AC
  Filled 2014-12-21: qty 1

## 2014-12-21 MED ORDER — BUPIVACAINE-EPINEPHRINE (PF) 0.5% -1:200000 IJ SOLN
INTRAMUSCULAR | Status: DC | PRN
  Administered 2014-12-21: 30 mL

## 2014-12-21 MED ORDER — TECHNETIUM TC 99M SULFUR COLLOID FILTERED
1.0000 | Freq: Once | INTRAVENOUS | Status: AC | PRN
  Administered 2014-12-21: 1 via INTRADERMAL

## 2014-12-21 MED ORDER — LIDOCAINE HCL (CARDIAC) 20 MG/ML IV SOLN
INTRAVENOUS | Status: AC
  Filled 2014-12-21: qty 10

## 2014-12-21 MED ORDER — ONDANSETRON HCL 4 MG PO TABS: 4.0000 mg | ORAL_TABLET | Freq: Four times a day (QID) | ORAL | Status: DC | PRN

## 2014-12-21 MED ORDER — OXYCODONE-ACETAMINOPHEN 5-325 MG PO TABS: 1.0000 | ORAL_TABLET | ORAL | Status: DC | PRN

## 2014-12-21 MED ORDER — HYDROMORPHONE HCL 1 MG/ML IJ SOLN
0.2500 mg | INTRAMUSCULAR | Status: DC | PRN
  Administered 2014-12-21 (×4): 0.5 mg via INTRAVENOUS

## 2014-12-21 MED ORDER — 0.9 % SODIUM CHLORIDE (POUR BTL) OPTIME
TOPICAL | Status: DC | PRN
Start: 2014-12-21 — End: 2014-12-21
  Administered 2014-12-21 (×2): 1000 mL

## 2014-12-21 MED ORDER — FENTANYL CITRATE 0.05 MG/ML IJ SOLN
INTRAMUSCULAR | Status: DC | PRN
  Administered 2014-12-21 (×5): 50 ug via INTRAVENOUS

## 2014-12-21 MED ORDER — PHENYLEPHRINE 40 MCG/ML (10ML) SYRINGE FOR IV PUSH (FOR BLOOD PRESSURE SUPPORT)
PREFILLED_SYRINGE | INTRAVENOUS | Status: AC
  Filled 2014-12-21: qty 10

## 2014-12-21 MED ORDER — ENOXAPARIN SODIUM 40 MG/0.4ML ~~LOC~~ SOLN
40.0000 mg | SUBCUTANEOUS | Status: DC
Start: 2014-12-22 — End: 2014-12-22
  Administered 2014-12-22: 40 mg via SUBCUTANEOUS
  Filled 2014-12-21: qty 0.4

## 2014-12-21 MED ORDER — CEFAZOLIN SODIUM 1-5 GM-% IV SOLN
1.0000 g | Freq: Three times a day (TID) | INTRAVENOUS | Status: AC
  Administered 2014-12-21 – 2014-12-22 (×3): 1 g via INTRAVENOUS
  Filled 2014-12-21 (×3): qty 50

## 2014-12-21 MED ORDER — OXYCODONE HCL 5 MG PO TABS: 5.0000 mg | ORAL_TABLET | Freq: Once | ORAL | Status: DC | PRN

## 2014-12-21 MED ORDER — SODIUM CHLORIDE 0.9 % IJ SOLN
INTRAMUSCULAR | Status: AC
  Filled 2014-12-21: qty 10

## 2014-12-21 MED ORDER — PROMETHAZINE HCL 25 MG/ML IJ SOLN
6.2500 mg | INTRAMUSCULAR | Status: DC | PRN
  Administered 2014-12-21: 6.25 mg via INTRAVENOUS

## 2014-12-21 MED ORDER — PROPOFOL 10 MG/ML IV BOLUS
INTRAVENOUS | Status: AC
  Filled 2014-12-21: qty 20

## 2014-12-21 MED ORDER — ONDANSETRON HCL 4 MG/2ML IJ SOLN
INTRAMUSCULAR | Status: AC
  Filled 2014-12-21: qty 2

## 2014-12-21 MED ORDER — MIDAZOLAM HCL 5 MG/5ML IJ SOLN
INTRAMUSCULAR | Status: DC | PRN
  Administered 2014-12-21: 2 mg via INTRAVENOUS

## 2014-12-21 MED ORDER — ONDANSETRON HCL 4 MG/2ML IJ SOLN
INTRAMUSCULAR | Status: DC | PRN
  Administered 2014-12-21 (×2): 4 mg via INTRAVENOUS

## 2014-12-21 MED ORDER — LACTATED RINGERS IV SOLN
INTRAVENOUS | Status: DC | PRN
  Administered 2014-12-21 (×2): via INTRAVENOUS

## 2014-12-21 MED ORDER — TAMOXIFEN CITRATE 10 MG PO TABS
20.0000 mg | ORAL_TABLET | Freq: Every day | ORAL | Status: DC
  Filled 2014-12-21 (×2): qty 2

## 2014-12-21 MED ORDER — LIDOCAINE HCL (CARDIAC) 20 MG/ML IV SOLN
INTRAVENOUS | Status: AC
  Filled 2014-12-21: qty 5

## 2014-12-21 MED ORDER — ROCURONIUM BROMIDE 100 MG/10ML IV SOLN
INTRAVENOUS | Status: DC | PRN
  Administered 2014-12-21: 40 mg via INTRAVENOUS
  Administered 2014-12-21: 10 mg via INTRAVENOUS

## 2014-12-21 MED ORDER — SCOPOLAMINE 1 MG/3DAYS TD PT72
MEDICATED_PATCH | TRANSDERMAL | Status: DC | PRN
  Administered 2014-12-21: 1 via TRANSDERMAL

## 2014-12-21 MED ORDER — OXYCODONE-ACETAMINOPHEN 5-325 MG PO TABS
1.0000 | ORAL_TABLET | ORAL | Status: DC | PRN
  Administered 2014-12-21: 2 via ORAL
  Administered 2014-12-21: 1 via ORAL
  Administered 2014-12-21 – 2014-12-22 (×3): 2 via ORAL
  Filled 2014-12-21 (×4): qty 2
  Filled 2014-12-21: qty 1

## 2014-12-21 MED ORDER — GENTAMICIN SULFATE 40 MG/ML IJ SOLN
Freq: Once | INTRAMUSCULAR | Status: AC
  Administered 2014-12-21: 1000 mL
  Filled 2014-12-21: qty 1

## 2014-12-21 MED ORDER — GLYCOPYRROLATE 0.2 MG/ML IJ SOLN
INTRAMUSCULAR | Status: DC | PRN
  Administered 2014-12-21: 0.4 mg via INTRAVENOUS

## 2014-12-21 MED ORDER — DEXAMETHASONE SODIUM PHOSPHATE 4 MG/ML IJ SOLN
INTRAMUSCULAR | Status: AC
  Filled 2014-12-21: qty 1

## 2014-12-21 MED ORDER — KETOROLAC TROMETHAMINE 15 MG/ML IJ SOLN
15.0000 mg | Freq: Three times a day (TID) | INTRAMUSCULAR | Status: DC
  Administered 2014-12-21 – 2014-12-22 (×3): 15 mg via INTRAVENOUS
  Filled 2014-12-21 (×3): qty 1

## 2014-12-21 MED ORDER — SCOPOLAMINE 1 MG/3DAYS TD PT72
MEDICATED_PATCH | TRANSDERMAL | Status: AC
  Filled 2014-12-21: qty 1

## 2014-12-21 MED ORDER — HYDROMORPHONE HCL 1 MG/ML IJ SOLN
INTRAMUSCULAR | Status: AC
  Filled 2014-12-21: qty 1

## 2014-12-21 MED ORDER — DIAZEPAM 5 MG PO TABS: 5.0000 mg | ORAL_TABLET | Freq: Three times a day (TID) | ORAL | Status: DC | PRN

## 2014-12-21 MED ORDER — NEOSTIGMINE METHYLSULFATE 10 MG/10ML IV SOLN
INTRAVENOUS | Status: DC | PRN
  Administered 2014-12-21: 2.5 mg via INTRAVENOUS

## 2014-12-21 MED ORDER — PROPOFOL 10 MG/ML IV BOLUS
INTRAVENOUS | Status: DC | PRN
  Administered 2014-12-21: 170 mg via INTRAVENOUS

## 2014-12-21 MED ORDER — METHYLENE BLUE 1 % INJ SOLN
INTRAMUSCULAR | Status: AC
  Filled 2014-12-21: qty 10

## 2014-12-21 MED ORDER — ROCURONIUM BROMIDE 50 MG/5ML IV SOLN
INTRAVENOUS | Status: AC
  Filled 2014-12-21: qty 1

## 2014-12-21 MED ORDER — KCL IN DEXTROSE-NACL 20-5-0.45 MEQ/L-%-% IV SOLN
INTRAVENOUS | Status: DC
Start: 2014-12-21 — End: 2014-12-22
  Administered 2014-12-21: 17:00:00 via INTRAVENOUS
  Filled 2014-12-21 (×3): qty 1000

## 2014-12-21 MED ORDER — ONDANSETRON HCL 4 MG/2ML IJ SOLN: 4.0000 mg | Freq: Four times a day (QID) | INTRAMUSCULAR | Status: DC | PRN

## 2014-12-21 MED ORDER — HYDROMORPHONE HCL 1 MG/ML IJ SOLN: 0.5000 mg | INTRAMUSCULAR | Status: DC | PRN

## 2014-12-21 MED ORDER — EPHEDRINE SULFATE 50 MG/ML IJ SOLN
INTRAMUSCULAR | Status: AC
  Filled 2014-12-21: qty 1

## 2014-12-21 MED ORDER — DEXAMETHASONE SODIUM PHOSPHATE 4 MG/ML IJ SOLN
INTRAMUSCULAR | Status: DC | PRN
  Administered 2014-12-21: 4 mg via INTRAVENOUS

## 2014-12-21 MED ORDER — SUCCINYLCHOLINE CHLORIDE 20 MG/ML IJ SOLN
INTRAMUSCULAR | Status: AC
  Filled 2014-12-21: qty 2

## 2014-12-21 MED ORDER — PHENYLEPHRINE HCL 10 MG/ML IJ SOLN
10.0000 mg | INTRAMUSCULAR | Status: DC | PRN
  Administered 2014-12-21: 10 ug/min via INTRAVENOUS

## 2014-12-21 MED ORDER — KETOROLAC TROMETHAMINE 30 MG/ML IJ SOLN
30.0000 mg | Freq: Once | INTRAMUSCULAR | Status: AC | PRN
  Administered 2014-12-21: 30 mg via INTRAVENOUS

## 2014-12-21 MED ORDER — ENOXAPARIN SODIUM 40 MG/0.4ML ~~LOC~~ SOLN: 40.0000 mg | SUBCUTANEOUS | Status: DC

## 2014-12-21 MED ORDER — DIPHENHYDRAMINE HCL 12.5 MG/5ML PO ELIX
12.5000 mg | ORAL_SOLUTION | Freq: Four times a day (QID) | ORAL | Status: DC | PRN
  Administered 2014-12-21: 12.5 mg via ORAL
  Filled 2014-12-21: qty 10

## 2014-12-21 MED ORDER — OXYCODONE HCL 5 MG/5ML PO SOLN: 5.0000 mg | Freq: Once | ORAL | Status: DC | PRN

## 2014-12-21 MED ORDER — PHENYLEPHRINE HCL 10 MG/ML IJ SOLN
INTRAMUSCULAR | Status: DC | PRN
  Administered 2014-12-21: 80 ug via INTRAVENOUS

## 2014-12-21 MED ORDER — MENTHOL 3 MG MT LOZG
1.0000 | LOZENGE | OROMUCOSAL | Status: DC | PRN
  Filled 2014-12-21: qty 9

## 2014-12-21 MED ORDER — SULFAMETHOXAZOLE-TRIMETHOPRIM 800-160 MG PO TABS: 1.0000 | ORAL_TABLET | Freq: Two times a day (BID) | ORAL | Status: DC

## 2014-12-21 MED ORDER — KETOROLAC TROMETHAMINE 30 MG/ML IJ SOLN
INTRAMUSCULAR | Status: AC
  Filled 2014-12-21: qty 1

## 2014-12-21 MED ORDER — MIDAZOLAM HCL 2 MG/2ML IJ SOLN
INTRAMUSCULAR | Status: AC
  Filled 2014-12-21: qty 2

## 2014-12-21 MED ORDER — GLYCOPYRROLATE 0.2 MG/ML IJ SOLN
INTRAMUSCULAR | Status: AC
  Filled 2014-12-21: qty 2

## 2014-12-21 MED ORDER — TAMOXIFEN CITRATE 20 MG PO TABS: 20.0000 mg | ORAL_TABLET | Freq: Every day | ORAL | Status: DC

## 2014-12-21 MED ORDER — FENTANYL CITRATE 0.05 MG/ML IJ SOLN
INTRAMUSCULAR | Status: AC
  Filled 2014-12-21: qty 5

## 2014-12-21 MED ORDER — LIDOCAINE HCL (CARDIAC) 20 MG/ML IV SOLN
INTRAVENOUS | Status: DC | PRN
  Administered 2014-12-21: 60 mg via INTRAVENOUS

## 2014-12-21 SURGICAL SUPPLY — 78 items
APPLIER CLIP 9.375 MED OPEN (MISCELLANEOUS) ×2
BAG DECANTER FOR FLEXI CONT (MISCELLANEOUS) ×2 IMPLANT
BINDER BREAST LRG (GAUZE/BANDAGES/DRESSINGS) IMPLANT
BINDER BREAST XLRG (GAUZE/BANDAGES/DRESSINGS) IMPLANT
BIOPATCH RED 1 DISK 7.0 (GAUZE/BANDAGES/DRESSINGS) IMPLANT
BLADE SURG 10 STRL SS (BLADE) IMPLANT
CANISTER SUCTION 2500CC (MISCELLANEOUS) ×4 IMPLANT
CHLORAPREP W/TINT 26ML (MISCELLANEOUS) ×2 IMPLANT
CLIP APPLIE 9.375 MED OPEN (MISCELLANEOUS) ×1 IMPLANT
CONT SPEC 4OZ CLIKSEAL STRL BL (MISCELLANEOUS) ×2 IMPLANT
COVER PROBE W GEL 5X96 (DRAPES) ×2 IMPLANT
COVER SURGICAL LIGHT HANDLE (MISCELLANEOUS) ×2 IMPLANT
DRAIN CHANNEL 15F RND FF W/TCR (WOUND CARE) IMPLANT
DRAIN CHANNEL 19F RND (DRAIN) ×4 IMPLANT
DRAPE CHEST BREAST 15X10 FENES (DRAPES) IMPLANT
DRAPE ORTHO SPLIT 77X108 STRL (DRAPES) ×2
DRAPE PROXIMA HALF (DRAPES) ×2 IMPLANT
DRAPE SURG ORHT 6 SPLT 77X108 (DRAPES) ×2 IMPLANT
DRAPE WARM FLUID 44X44 (DRAPE) ×2 IMPLANT
DRSG PAD ABDOMINAL 8X10 ST (GAUZE/BANDAGES/DRESSINGS) ×4 IMPLANT
DRSG TEGADERM 2-3/8X2-3/4 SM (GAUZE/BANDAGES/DRESSINGS) ×2 IMPLANT
DRSG TEGADERM 4X4.75 (GAUZE/BANDAGES/DRESSINGS) ×2 IMPLANT
ELECT BLADE 4.0 EZ CLEAN MEGAD (MISCELLANEOUS) ×2
ELECT CAUTERY BLADE 6.4 (BLADE) ×2 IMPLANT
ELECT REM PT RETURN 9FT ADLT (ELECTROSURGICAL) ×4
ELECTRODE BLDE 4.0 EZ CLN MEGD (MISCELLANEOUS) ×1 IMPLANT
ELECTRODE REM PT RTRN 9FT ADLT (ELECTROSURGICAL) ×2 IMPLANT
EVACUATOR SILICONE 100CC (DRAIN) ×4 IMPLANT
GAUZE SPONGE 4X4 12PLY STRL (GAUZE/BANDAGES/DRESSINGS) IMPLANT
GLOVE BIO SURGEON STRL SZ 6 (GLOVE) ×4 IMPLANT
GLOVE BIO SURGEON STRL SZ7 (GLOVE) ×4 IMPLANT
GLOVE BIOGEL PI IND STRL 6 (GLOVE) ×1 IMPLANT
GLOVE BIOGEL PI IND STRL 7.0 (GLOVE) ×3 IMPLANT
GLOVE BIOGEL PI IND STRL 7.5 (GLOVE) ×1 IMPLANT
GLOVE BIOGEL PI INDICATOR 6 (GLOVE) ×1
GLOVE BIOGEL PI INDICATOR 7.0 (GLOVE) ×3
GLOVE BIOGEL PI INDICATOR 7.5 (GLOVE) ×1
GLOVE SURG SS PI 6.0 STRL IVOR (GLOVE) ×2 IMPLANT
GLOVE SURG SS PI 7.0 STRL IVOR (GLOVE) ×4 IMPLANT
GOWN STRL REUS W/ TWL LRG LVL3 (GOWN DISPOSABLE) ×3 IMPLANT
GOWN STRL REUS W/TWL LRG LVL3 (GOWN DISPOSABLE) ×3
GRAFT FLEX HD 4X16 THICK (Tissue Mesh) ×2 IMPLANT
IMPL BREAST ARTOURA 375CC (Breast) ×1 IMPLANT
IMPLANT BREAST ARTOURA 375CC (Breast) ×2 IMPLANT
KIT BASIN OR (CUSTOM PROCEDURE TRAY) ×2 IMPLANT
KIT ROOM TURNOVER OR (KITS) ×2 IMPLANT
LIQUID BAND (GAUZE/BANDAGES/DRESSINGS) IMPLANT
MARKER SKIN DUAL TIP RULER LAB (MISCELLANEOUS) ×2 IMPLANT
NEEDLE 18GX1X1/2 (RX/OR ONLY) (NEEDLE) IMPLANT
NEEDLE HYPO 25GX1X1/2 BEV (NEEDLE) IMPLANT
NS IRRIG 1000ML POUR BTL (IV SOLUTION) ×4 IMPLANT
PACK GENERAL/GYN (CUSTOM PROCEDURE TRAY) ×2 IMPLANT
PAD ARMBOARD 7.5X6 YLW CONV (MISCELLANEOUS) ×2 IMPLANT
PIN SAFETY STERILE (MISCELLANEOUS) ×2 IMPLANT
SET ASEPTIC TRANSFER (MISCELLANEOUS) ×2 IMPLANT
SOLUTION BETADINE 4OZ (MISCELLANEOUS) IMPLANT
SPECIMEN JAR X LARGE (MISCELLANEOUS) ×2 IMPLANT
SPONGE LAP 18X18 X RAY DECT (DISPOSABLE) ×4 IMPLANT
STAPLER VISISTAT 35W (STAPLE) ×2 IMPLANT
SUT CHROMIC 5 0 P 3 (SUTURE) IMPLANT
SUT ETHILON 2 0 FS 18 (SUTURE) ×4 IMPLANT
SUT MNCRL AB 4-0 PS2 18 (SUTURE) ×4 IMPLANT
SUT MON AB 5-0 PS2 18 (SUTURE) ×4 IMPLANT
SUT PDS AB 2-0 CT1 27 (SUTURE) ×4 IMPLANT
SUT SILK 2 0 FS (SUTURE) ×2 IMPLANT
SUT SILK 3 0 SH 30 (SUTURE) IMPLANT
SUT VIC AB 3-0 PS2 18 (SUTURE)
SUT VIC AB 3-0 PS2 18XBRD (SUTURE) IMPLANT
SUT VIC AB 3-0 SH 27 (SUTURE) ×4
SUT VIC AB 3-0 SH 27X BRD (SUTURE) ×4 IMPLANT
SUT VIC AB 3-0 SH 8-18 (SUTURE) ×4 IMPLANT
SUT VICRYL 4-0 PS2 18IN ABS (SUTURE) ×2 IMPLANT
SYR CONTROL 10ML LL (SYRINGE) IMPLANT
TOWEL OR 17X24 6PK STRL BLUE (TOWEL DISPOSABLE) IMPLANT
TOWEL OR 17X26 10 PK STRL BLUE (TOWEL DISPOSABLE) ×2 IMPLANT
TRAY FOLEY CATH 16FRSI W/METER (SET/KITS/TRAYS/PACK) IMPLANT
TUBE CONNECTING 12X1/4 (SUCTIONS) ×4 IMPLANT
YANKAUER SUCT BULB TIP NO VENT (SUCTIONS) ×2 IMPLANT

## 2014-12-21 NOTE — Anesthesia Postprocedure Evaluation (Signed)
  Anesthesia Post-op Note  Patient: Loretta Sanchez  Procedure(s) Performed: Procedure(s): RIGHT SKIN SPARING MASTECTOMY WITH RIGHT  AXILLARY SENTINEL LYMPH NODE BIOPSY (Right) RIGHT  BREAST RECONSTRUCTION WITH PLACEMENT OF TISSUE EXPANDER AND  ACELLULAR DERMIS (Right)  Patient Location: PACU  Anesthesia Type:GA combined with regional for post-op pain  Level of Consciousness: awake and alert   Airway and Oxygen Therapy: Patient Spontanous Breathing  Post-op Pain: mild  Post-op Assessment: Post-op Vital signs reviewed  Post-op Vital Signs: Reviewed  Last Vitals:  Filed Vitals:   12/21/14 1241  BP: 114/65  Pulse: 106  Temp: 37.1 C  Resp: 12    Complications: No apparent anesthesia complications

## 2014-12-21 NOTE — H&P (Signed)
39 yof who noted ruoq pain and mass after wrestling with her daughter. She has had some brownish discharge from the right nipple that has been spontaneous. She has undergone mm that shows an area of calcifications 5x6x7 cm. A few of these calcifications project over the skin in the RA right breast. the left breast is negative. US shows focally dilated ducts in the 9 oclock region measuring at least 7 cm. US of the right axilla is negative. This has undergone stereo biopsy with clip placement. MRI shows a normal left breast and normal lymph nodes. The right breast shows an area of 3.5x9x2.5 cm in size. This approaches and appears to radiologically involve the right nac. She has no complaints now except tenderness at biopsy site. Genetic testing is negative. There will be a delay for surgery due to this and dr Jana Hakim has started her on tamoxifen.  she underwent biopsy that shows erpos, pr pos hg dcis with papillary features. there is associated alh.   Other Problems Marjean Donna, CMA; 11/24/2014 8:24 AM) Breast Cancer Migraine Headache  Past Surgical History Marjean Donna, CMA; 11/24/2014 8:24 AM) Breast Biopsy Right.  Diagnostic Studies History Marjean Donna, CMA; 11/24/2014 8:24 AM) Colonoscopy never Mammogram within last year Pap Smear 1-5 years ago  Allergies Davy Pique Bynum, CMA; 11/24/2014 8:25 AM) No Known Drug Allergies03/15/2016  Medication History Davy Pique Bynum, CMA; 11/24/2014 8:26 AM) NuvaRing (0.12-0.015MG /24HR Ring, Vaginal) Active. ZyrTEC Allergy (10MG  Capsule, Oral) Active. Medications Reconciled  Social History Marjean Donna, CMA; 11/24/2014 8:24 AM) No alcohol use No drug use Tobacco use Never smoker.  Family History Marjean Donna, [place]; 11/24/2014 8:24 AM) Cancer Family Members In General. Hypertension Father, Mother.  Pregnancy / Birth History Marjean Donna, [place]; 11/24/2014 8:24 AM) Age at menarche 32 years. Contraceptive History Contraceptive  implant. Gravida 3 Irregular periods Maternal age 5-30 Para 2  Review of Systems ([place]; 11/24/2014 8:24 AM) General Not Present- Appetite Loss, Chills, Fatigue, Fever, Night Sweats, Weight Gain and Weight Loss. Skin Not Present- Change in Wart/Mole, Dryness, Hives, Jaundice, New Lesions, Non-Healing Wounds, Rash and Ulcer. HEENT Present- Wears glasses/contact lenses. Not Present- Earache, Hearing Loss, Hoarseness, Nose Bleed, Oral Ulcers, Ringing in the Ears, Seasonal Allergies, Sinus Pain, Sore Throat, Visual Disturbances and Yellow Eyes. Breast Present- Breast Mass and Breast Pain. Not Present- Nipple Discharge and Skin Changes. Cardiovascular Not Present- Chest Pain, Difficulty Breathing Lying Down, Leg Cramps, Palpitations, Rapid Heart Rate, Shortness of Breath and Swelling of Extremities. Gastrointestinal Not Present- Abdominal Pain, Bloating, Bloody Stool, Change in Bowel Habits, Chronic diarrhea, Constipation, Difficulty Swallowing, Excessive gas, Gets full quickly at meals, Hemorrhoids, Indigestion, Nausea, Rectal Pain and Vomiting. Female Genitourinary Not Present- Frequency, Nocturia, Painful Urination, Pelvic Pain and Urgency. Musculoskeletal Not Present- Back Pain, Joint Pain, Joint Stiffness, Muscle Pain, Muscle Weakness and Swelling of Extremities. Neurological Present- Headaches. Not Present- Decreased Memory, Fainting, Numbness, Seizures, Tingling, Tremor, Trouble walking and Weakness. Psychiatric Not Present- Anxiety, Bipolar, Change in Sleep Pattern, Depression, Fearful and Frequent crying. Endocrine Not Present- Cold Intolerance, Excessive Hunger, Hair Changes, Heat Intolerance, Hot flashes and New Diabetes. Hematology Not Present- Easy Bruising, Excessive bleeding, Gland problems, HIV and Persistent Infections.   Vitals (Sonya Bynum CMA; 11/24/2014 8:25 AM) 11/24/2014 8:25 AM Weight: 165 lb Height: 69in Body Surface Area: 1.91 m Body Mass Index: 24.37  kg/m Temp.: 97.74F(Temporal)  Pulse: 90 (Regular)  BP: 118/80 (Sitting, Left Arm, Standard)    Physical Exam Rolm Bookbinder MD; 11/24/2014 11:52 AM) General Mental Status-Alert. Orientation-Oriented X3.  Chest and Lung Exam Chest and lung exam reveals -on auscultation, normal breath sounds, no adventitious sounds and normal vocal resonance.  Breast Nipples-No Discharge. Note: left breast without mass right breast uoq with hematoma and vague nodularity   Cardiovascular Cardiovascular examination reveals -normal heart sounds, regular rate and rhythm with no murmurs.  Lymphatic Head & Neck  General Head & Neck Lymphatics: Bilateral - Description - Normal. Axillary  General Axillary Region: Bilateral - Description - Normal. Note: no [place] adenopathy     Assessment & Plan Rolm Bookbinder MD; 11/24/2014 11:56 AM) DCIS (DUCTAL CARCINOMA IN SITU), RIGHT (233.0  D05.11) Story: Right skin sparing mastectomy, right axillary sentinel node biopsy

## 2014-12-21 NOTE — Op Note (Signed)
Operative Note   DATE OF OPERATION: 4.11.2016  LOCATION: [facility] OR-observation  SURGICAL DIVISION: Plastic Surgery  PREOPERATIVE DIAGNOSES:  1. Right breast DCIS  POSTOPERATIVE DIAGNOSES:  same  PROCEDURE:  1. Right breast reconstruction with tissue expander 2. Acellular dermis (Flex HD) for breast reconstruction 56 cm2.  SURGEON: Irene Limbo MD MBA  ASSISTANT: none  ANESTHESIA:  General.   EBL: 150 ml for entire case  COMPLICATIONS: None.   INDICATIONS FOR PROCEDURE:  The patient, Loretta Sanchez, is a 39 y.o. female born on Jul 22, 1976, is here for immediate breast reconstruction following skin sparing mastectomy.   FINDINGS: Mentor Artoura 375 ml tissue expander, Initial fill volume 180 ml. Ref [id number]  DESCRIPTION OF PROCEDURE:  The patient's operative site was marked with the patient in the preoperative area to mark sternal notch, breast meridians, anterior axillary lines and inframammary folds. The patient was taken to the operating room. SCDs were placed and IV antibiotics were given. The patient's operative site was prepped and draped in a sterile fashion. A time out was performed and all information was confirmed to be correct. Following completion of mastectomy, the inferior insertions of pectoralis major muscle were divided and submuscular dissection completed. Acelluar dermis (Flex HD) was perforated and sewn to inferior border of pectoralis major with running 3-0 vicryl. A 19 Fr drain was placed in submuscular position and secured to skin with 2-0 nylon. The cavity was irrigated with solution containing Ancef, genatmicin, and bacitracin. The tissue expander was prepared and placed in submuscular position. The expander was secured to chest wall with a 3-0 vicryl. The inferior border of the acellular dermis was inset to inframammary fold and laterally to serratus muscle. The incision was closed with 3-0 vicryl in fascial layer and 4-0 vicryl in dermis.  Skin closure completed with 4-0 monocryl subcuticular and tissue adhesive.The port was accessed and filled to 180 ml. Dry dressing and breast binder applied.   The patient was allowed to wake from anesthesia, extubated and taken to the recovery room in satisfactory condition.   SPECIMENS: none  DRAINS: 19 Fr JP in right reconstructed breast  Irene Limbo, MD [facility] Plastic & Reconstructive Surgery [phone]

## 2014-12-21 NOTE — Interval H&P Note (Signed)
History and Physical Interval Note:  12/21/2014 7:21 AM  Loretta Sanchez  has presented today for surgery, with the diagnosis of RIGHT BREAST CANCER  The various methods of treatment have been discussed with the patient and family. After consideration of risks, benefits and other options for treatment, the patient has consented to  Procedure(s): RIGHT SKIN SPARING MASTECTOMY WITH RIGHT  AXILLARY SENTINEL NODE BIOPSY (Right) RIGHT  BREAST RECONSTRUCTION WITH PLACEMENT OF TISSUE EXPANDER AND  POSSIBLE ACELLULAR DERMIS (Right) as a surgical intervention .  The patient's history has been reviewed, patient examined, no change in status, stable for surgery.  I have reviewed the patient's chart and labs.  Questions were answered to the patient's satisfaction.     [name]

## 2014-12-21 NOTE — Anesthesia Preprocedure Evaluation (Addendum)
Anesthesia Evaluation  Patient identified by MRN, date of birth, ID band Patient awake    Reviewed: Allergy & Precautions, NPO status , Patient's Chart, lab work & pertinent test results  Airway Mallampati: I  TM Distance: >3 FB Neck ROM: Full    Dental  (+) Teeth Intact, Dental Advisory Given   Pulmonary neg pulmonary ROS,  breath sounds clear to auscultation        Cardiovascular negative cardio ROS  Rhythm:Regular Rate:Normal     Neuro/Psych negative neurological ROS     GI/Hepatic negative GI ROS, Neg liver ROS,   Endo/Other  negative endocrine ROS  Renal/GU negative Renal ROS     Musculoskeletal   Abdominal   Peds  Hematology negative hematology ROS (+)   Anesthesia Other Findings   Reproductive/Obstetrics                            Anesthesia Physical Anesthesia Plan  ASA: II  Anesthesia Plan: General and Regional   Post-op Pain Management:    Induction: Intravenous  Airway Management Planned: Oral ETT  Additional Equipment:   Intra-op Plan:   Post-operative Plan: Extubation in OR  Informed Consent: I have reviewed the patients History and Physical, chart, labs and discussed the procedure including the risks, benefits and alternatives for the proposed anesthesia with the patient or authorized representative who has indicated his/her understanding and acceptance.   Dental advisory given  Plan Discussed with: CRNA  Anesthesia Plan Comments:        Anesthesia Quick Evaluation

## 2014-12-21 NOTE — Anesthesia Procedure Notes (Addendum)
Anesthesia Regional Block:  Pectoralis block  Pre-Anesthetic Checklist: ,, timeout performed, Correct Patient, Correct Site, Correct Laterality, Correct Procedure, Correct Position, site marked, Risks and benefits discussed,  Surgical consent,  Pre-op evaluation,  At surgeon's request and post-op pain management  Laterality: Right  Prep: chloraprep       Needles:   Needle Type: Echogenic Needle     Needle Length: 9cm 9 cm Needle Gauge: 21 and 21 G    Additional Needles:  Procedures: ultrasound guided (picture in chart) Pectoralis block Narrative:  Start time: 12/21/2014 7:05 AM End time: 12/21/2014 7:15 AM Injection made incrementally with aspirations every 5 mL.  Performed by: Personally  Anesthesiologist: Suzette Battiest  Additional Notes: Risks and benefits discussed with pt. Pt tolerated procedure well with no immediate complications.   Procedure Name: Intubation Date/Time: 12/21/2014 7:40 AM Performed by: Kyung Rudd Pre-anesthesia Checklist: Patient identified, Emergency Drugs available, Suction available, Patient being monitored and Timeout performed Patient Re-evaluated:Patient Re-evaluated prior to inductionOxygen Delivery Method: Circle system utilized Preoxygenation: Pre-oxygenation with 100% oxygen Intubation Type: IV induction Ventilation: Mask ventilation without difficulty Laryngoscope Size: Mac and 3 Grade View: Grade I Tube type: Oral Tube size: 7.0 mm Number of attempts: 1 Airway Equipment and Method: Stylet Placement Confirmation: ETT inserted through vocal cords under direct vision,  positive ETCO2 and breath sounds checked- equal and bilateral Secured at: 21 cm Tube secured with: Tape Dental Injury: Teeth and Oropharynx as per pre-operative assessment

## 2014-12-21 NOTE — Interval H&P Note (Deleted)
History and Physical Interval Note:  12/21/2014 7:03 AM  Loretta Sanchez  has presented today for surgery, with the diagnosis of RIGHT BREAST  The various methods of treatment have been discussed with the patient and family. After consideration of risks, benefits and other options for treatment, the patient has consented to  Procedure(s): RIGHT SKIN SPARING MASTECTOMY WITH RIGHT  AXILLARY SENTINEL NODE BIOPSY (Right) RIGHT  BREAST RECONSTRUCTION WITH PLACEMENT OF TISSUE EXPANDER AND  POSSIBLE ACELLULAR DERMIS (Right) as a surgical intervention .  The patient's history has been reviewed, patient examined, no change in status, stable for surgery.  I have reviewed the patient's chart and labs.  Questions were answered to the patient's satisfaction.     Loretta Sanchez

## 2014-12-21 NOTE — Transfer of Care (Signed)
Immediate Anesthesia Transfer of Care Note  Patient: Loretta Sanchez  Procedure(s) Performed: Procedure(s): RIGHT SKIN SPARING MASTECTOMY WITH RIGHT  AXILLARY SENTINEL LYMPH NODE BIOPSY (Right) RIGHT  BREAST RECONSTRUCTION WITH PLACEMENT OF TISSUE EXPANDER AND  ACELLULAR DERMIS (Right)  Patient Location: PACU  Anesthesia Type:General  Level of Consciousness: awake, alert  and oriented  Airway & Oxygen Therapy: Patient Spontanous Breathing and Patient connected to nasal cannula oxygen  Post-op Assessment: Report given to RN, Post -op Vital signs reviewed and stable and Patient moving all extremities  Post vital signs: Reviewed and stable  Last Vitals:  Filed Vitals:   12/21/14 0604  BP: 126/63  Pulse: 74  Temp: 36.7 C  Resp: 18    Complications: No apparent anesthesia complications

## 2014-12-21 NOTE — Op Note (Signed)
Preoperative diagnosis: right breast dcis, right nipple discharge Postoperative diagnosis: same as above Procedure:  1. Right skin sparing mastectomy 2. Right axillary sentinel node biopsy Surgeon: Dr Serita Grammes Asst: Dr Irene Limbo EBL: minimal Drains per plastic surgery Specimen: right breast marked with short superior, long lateral Right axillary sn with count of 588 Complications none Sponge count correct at completion Case turned over to plastic surgery  Indications: This is a 39 year old female who presents with right nipple discharge. She has undergone evaluation and has right breast ductal carcinoma site to that looks like it's fairly large area. Due to her discharge and the appearance we have elected to proceed with a right skin sparing mastectomy with immediate reconstruction and a sentinel lymph node biopsy.  Procedure: After informed consent was obtained patient was taken to the operating room. She was given cefazolin. Sequential compression devices were on her legs. She was then placed under general anesthesia without complication. She had previously undergone a pectoral block. Her chest was then prepped and draped in the standard sterile surgical fashion. A surgical timeout was then performed.  I made a minimal elliptical incision encompassing the nipple and the areola to save his much skin as possible. I then created flaps superiorly, medially, inferiorly to the inframammary crease and laterally to the latissimus. I then removed the breast from the pectoralis muscle including the pectoralis fascia. This was then marked as above. Hemostasis was then obtained. I then was able to locate a single sentinel node with a count as listed above. I removed this and there was no background radioactivity. I then obtained hemostasis. I then turned that case over to plastic surgery for reconstruction.

## 2014-12-22 ENCOUNTER — Encounter: Payer: Self-pay | Admitting: Genetic Counselor

## 2014-12-22 DIAGNOSIS — C50911 Malignant neoplasm of unspecified site of right female breast: Secondary | ICD-10-CM

## 2014-12-22 DIAGNOSIS — Z800 Family history of malignant neoplasm of digestive organs: Secondary | ICD-10-CM

## 2014-12-22 MED ORDER — OXYCODONE-ACETAMINOPHEN 5-325 MG PO TABS: 1.0000 | ORAL_TABLET | ORAL | Status: DC | PRN

## 2014-12-22 NOTE — Discharge Instructions (Signed)
[facility] °[phone] ° °MASTECTOMY: POST OP INSTRUCTIONS ° °Always review your discharge instruction sheet given to you by the facility where your surgery was performed. °IF YOU HAVE DISABILITY OR FAMILY LEAVE FORMS, YOU MUST BRING THEM TO THE OFFICE FOR PROCESSING.   °DO NOT GIVE THEM TO YOUR DOCTOR. °A prescription for pain medication may be given to you upon discharge.  Take your pain medication as prescribed, if needed.  If narcotic pain medicine is not needed, then you may take acetaminophen (Tylenol), naprosyn (Alleve) or ibuprofen (Advil) as needed. °1. Take your usually prescribed medications unless otherwise directed. °2. If you need a refill on your pain medication, please contact your pharmacy.  They will contact our office to request authorization.  Prescriptions will not be filled after 5pm or on week-ends. °3. You should follow a light diet the first few days after arrival home, such as soup and crackers, etc.  Resume your normal diet the day after surgery. °4. Most patients will experience some swelling and bruising on the chest and underarm.  Ice packs will help.  Swelling and bruising can take several days to resolve. Wear the binder day and night until you return to the office.  °5. It is common to experience some constipation if taking pain medication after surgery.  Increasing fluid intake and taking a stool softener (such as Colace) will usually help or prevent this problem from occurring.  A mild laxative (Milk of Magnesia or Miralax) should be taken according to package instructions if there are no bowel movements after 48 hours. °6. Unless discharge instructions indicate otherwise, leave your bandage dry and in place until your next appointment in 3-5 days.  You may take a limited sponge bath.  No tube baths or showers until the drains are removed.  You may have steri-strips (small skin tapes) in place directly over the incision.  These strips should be left on the  skin for 7-10 days. If you have glue it will come off in next couple week.  Any sutures will be removed at an office visit °7. DRAINS:  If you have drains in place, it is important to keep a list of the amount of drainage produced each day in your drains.  Before leaving the hospital, you should be instructed on drain care.  Call our office if you have any questions about your drains. I will remove your drains when they put out less than 30 cc or ml for 2 consecutive days. °8. ACTIVITIES:  You may resume regular (light) daily activities beginning the next day--such as daily self-care, walking, climbing stairs--gradually increasing activities as tolerated.  You may have sexual intercourse when it is comfortable.  Refrain from any heavy lifting or straining until approved by your doctor. °a. You may drive when you are no longer taking prescription pain medication, you can comfortably wear a seatbelt, and you can safely maneuver your car and apply brakes. °b. RETURN TO WORK:  __________________________________________________________ °9. You should see your doctor in the office for a follow-up appointment approximately 3-5 days after your surgery.  Your doctor’s nurse will typically make your follow-up appointment when she calls you with your pathology report.  Expect your pathology report 3-4business days after surgery. °10. OTHER INSTRUCTIONS: ______________________________________________________________________________________________ ____________________________________________________________________________________________ °WHEN TO CALL YOUR DR Lura Falor: °1. Fever over 101.0 °2. Nausea and/or vomiting °3. Extreme swelling or bruising °4. Continued bleeding from incision. °5. Increased pain, redness, or drainage from the incision. °The clinic staff is available   to answer your questions during regular business hours.  Please don’t hesitate to call and ask to speak to one of the nurses for clinical concerns.  If  you have a medical emergency, go to the nearest emergency room or call 911.  A surgeon from [facility] is always on call at the hospital. °[address], [address], [place], [place]  [address] ? [address] [address], [place], [place]   [address] °[phone] ? [phone] ? FAX [phone] °Web site: [email or web address] ° °

## 2014-12-22 NOTE — Progress Notes (Signed)
[facility]  Genetic Test Results   REFERRING PROVIDER: Dr. Lurline Del  PRIMARY PROVIDER:  Jerlyn Ly, MD  PRIMARY REASON FOR VISIT:  Personal history of breast cancer  GENETIC TEST RESULT:  Testing Laboratory: [facility]  Test Ordered: OvaNext gene panel Date of Report: 12/21/2014 Result: Normal, no pathogenic mutations identified General Interpretation: Reassuring  HPI: Loretta Sanchez was previously seen in the [facility] due to concerns regarding a hereditary predisposition to cancer. Please refer to our prior cancer genetics clinic note for more information regarding Loretta Sanchez's medical, social and family histories, and our assessment and recommendations, at the time. Loretta Sanchez genetic test results and recommendations warranted by these results were recently disclosed to her and are discussed in more detail below.  GENETIC TEST RESULTS: At the time of Loretta Sanchez's visit, we recommended she pursue genetic testing, which includes sequencing and deletion/duplication analysis of several genes associated with an increased risk for cancer via a gene panel. The OvaNext gene panel offered by [employer] includes sequencing and rearrangement analysis for the following 24 genes:ATM, BARD1, BRCA1, BRCA2, BRIP1, CDH1, CHEK2, EPCAM, MLH1, MRE11A, MSH2, MSH6, MUTYH, NBN, NF1, PALB2, PMS2, PTEN, RAD50, RAD51C, RAD51D, SMARCA4, STK11, and TP53. Genetic testing for this gene panel was normal and did not reveal a pathogenic mutation in any of these genes. A copy of the genetic test report will be scanned into Epic under the media tab.   We discussed with Loretta Sanchez that current genetic testing is not perfect, and it is, therefore, possible there may be a pathogenic gene mutation in one of these genes that current testing cannot detect, but that chance is small.  We also discussed, that it is possible that another gene that has  not yet been discovered, or that we have not yet tested, is responsible for the cancer diagnoses in her family. It is, therefore, important for Loretta Sanchez to continue to remain in touch with cancer genetics so that we can continue to offer Loretta Sanchez the most up to date genetic testing.   CANCER SCREENING RECOMMENDATIONS: This result is reassuring and indicates that Loretta Sanchez likely does not have an increased risk for a future cancer due to a mutation in one of these genes. This normal test also suggests that Loretta Sanchez's cancer was most likely not due to an inherited predisposition associated with one of these genes.  Most cancers happen by chance and this negative test suggests that her cancer falls into this category.  We, therefore, recommended she continue to follow the cancer management and screening guidelines provided by her oncology and primary healthcare provider.   RECOMMENDATIONS FOR FAMILY MEMBERS:  Individuals in this family might be at some increased risk of developing cancer, over the general population risk, simply due to the family history of cancer.  We recommended women in this family have a yearly mammogram beginning at age 73, an annual clinical breast exam, and perform monthly breast self-exams. Women in this family should also have a gynecological exam as recommended by their primary provider. All family members should have a colonoscopy by age 57, an annual dermatological exam and continue to follow cancer screening guidelines recommended by their healthcare provider.  FOLLOW-UP: Lastly, we discussed with Loretta Sanchez that cancer genetics is a rapidly advancing field and it is likely that new genetic tests will be appropriate for her and/or family members in the future. We encouraged her to remain in  contact with cancer genetics on an annual basis so we can update her personal and family histories and let her know of advances in cancer genetics that may benefit this family.   Our  contact number was provided. Loretta Sanchez questions were answered to her satisfaction, and she knows she is welcome to call us at anytime with additional questions or concerns.    Catherine A. Fine, MS, CGC Certified Psychologist, [email or web address] Phone: [phone]

## 2014-12-22 NOTE — Discharge Summary (Signed)
Physician Discharge Summary  Patient ID: Loretta Sanchez MRN: [id number] DOB/AGE: 16-Jan-1976 39 y.o.  Admit date: 12/21/2014 Discharge date: 12/22/2014  Admission Diagnoses: DCIS right breast  Discharge Diagnoses:  Active Problems:   DCIS (ductal carcinoma in situ) of breast   Discharged Condition: stable  Hospital Course: Postoperatively pain controlled with oral medications and toradol. Had some light headedness with ambulation, but overall pain controlled and tolerating diet.  Treatments: surgery: right skin sparing mastectomy with sentinel node, reconstruction with expander and acellular dermis  Discharge Exam: Blood pressure 94/50, pulse 76, temperature 98.7 F (37.1 C), temperature source Oral, resp. rate 18, height 5\' 9"  (1.753 m), weight 74.844 kg (165 lb), SpO2 99 %. Incision/Wound: dry intact no drainage, drain serosanguinous, chest flat  Disposition:   Discharge Instructions    Call MD for:  redness, tenderness, or signs of infection (pain, swelling, bleeding, redness, odor or green/yellow discharge around incision site)    Complete by:  As directed      Call MD for:  severe or increased pain, loss or decreased feeling  in affected limb(s)    Complete by:  As directed      Discharge instructions    Complete by:  As directed   [name] to shower starting 12/23/14. Pat incisions dry. Dry dressing as desired. Breast binder or soft support bra all times.  Strip and record drain twice daily and bring log to clinic visit.  No strenuous activity, housework or exercise.     Driving Restrictions    Complete by:  As directed   No driving while taking narcotics     Lifting restrictions    Complete by:  As directed   No lifting greater than 5 lbs     Resume previous diet    Complete by:  As directed             Medication List    STOP taking these medications        tamoxifen 20 MG tablet  Commonly known as:  NOLVADEX      TAKE these medications        cetirizine 10  MG tablet  Commonly known as:  ZYRTEC  Take 10 mg by mouth daily as needed for allergies.     diazepam 5 MG tablet  Commonly known as:  VALIUM  Take 1 tablet (5 mg total) by mouth every 8 (eight) hours as needed for anxiety or muscle spasms.     multivitamin with minerals tablet  Take 1 tablet by mouth daily.     oxyCODONE-acetaminophen 5-325 MG per tablet  Commonly known as:  ROXICET  Take 1-2 tablets by mouth every 4 (four) hours as needed for severe pain.     sulfamethoxazole-trimethoprim 800-160 MG per tablet  Commonly known as:  BACTRIM DS,SEPTRA DS  Take 1 tablet by mouth 2 (two) times daily.           Follow-up Information    Follow up with Rolm Bookbinder, MD.   Specialty:  General Surgery   Contact information:   [place] [address] [phone]       Follow up with [facility], Arnoldo Hooker, MD In 1 week.   Specialty:  Plastic Surgery   Why:  as scheduled   Contact information:   [place] [address] [phone]       Follow up with Rolm Bookbinder, MD In 2 weeks.   Specialty:  General Surgery   Contact  information:   [address] [place] [address] [phone]       Signed: Irene Limbo 12/22/2014, 11:30 AM

## 2014-12-22 NOTE — Progress Notes (Signed)
1 Day Post-Op  Subjective: Doing pretty well, some itching  Objective: Vital signs in last 24 hours: Temp:  [97.8 F (36.6 C)-99 F (37.2 C)] 98.7 F (37.1 C) (04/12 0513) Pulse Rate:  [76-115] 76 (04/12 0513) Resp:  [9-18] 18 (04/12 0513) BP: (94-117)/(50-67) 94/50 mmHg (04/12 0513) SpO2:  [97 %-100 %] 99 % (04/12 0513) Last BM Date: 12/20/14  Intake/Output from previous day: 04/11 0701 - 04/12 0700 In: 2872.5 [P.O.:830; I.V.:2042.5] Out: 495 [Drains:345; Blood:150] Intake/Output this shift:    General appearance: no distress Incision/Wound: some ecchymosis, flaps viable, no hematoma, drain serosang  Anti-infectives: Anti-infectives    Start     Dose/Rate Route Frequency Ordered Stop   12/21/14 1500  ceFAZolin (ANCEF) IVPB 1 g/50 mL premix     1 g 100 mL/hr over 30 Minutes Intravenous Every 8 hours 12/21/14 1249 12/22/14 0642   12/21/14 0730  bacitracin 50,000 Units, gentamicin (GARAMYCIN) 80 mg, ceFAZolin (ANCEF) 1 g in sodium chloride 0.9 % 1,000 mL      Irrigation Once 12/21/14 0726 12/21/14 0810   12/21/14 0600  ceFAZolin (ANCEF) IVPB 2 g/50 mL premix     2 g 100 mL/hr over 30 Minutes Intravenous On call to O.R. 12/20/14 1309 12/21/14 0755   12/21/14 0000  sulfamethoxazole-trimethoprim (BACTRIM DS,SEPTRA DS) 800-160 MG per tablet     1 tablet Oral 2 times daily 12/21/14 1012        Assessment/Plan: POD 1 right ssm/sn biopsy/expander  1. Po pain meds 2. Decreased bp likely pain med related and young healthy woman 3. Oob/ambulate 4. If does well possibly home later 5. Path pending 6. Will hold tamoxifen until follow up postop  [facility] 12/22/2014

## 2014-12-22 NOTE — Progress Notes (Signed)
Discharge instructions and prescriptions given and explained to pt.  JP teaching completed and pt demonstrated proper way to empty drain and record.  IV removed and site CDI.  Pt denies any questions at this time.  Pt in no s/s of distress and discharged to home with husband. Graceann Congress

## 2014-12-22 NOTE — Progress Notes (Signed)
UR completed 

## 2014-12-24 ENCOUNTER — Telehealth: Payer: Self-pay | Admitting: Oncology

## 2014-12-24 ENCOUNTER — Encounter (HOSPITAL_COMMUNITY): Payer: Self-pay | Admitting: General Surgery

## 2014-12-24 ENCOUNTER — Other Ambulatory Visit: Payer: Self-pay | Admitting: Oncology

## 2014-12-24 NOTE — Telephone Encounter (Signed)
per pof to sch pt appt-gave pt copy of sch °

## 2014-12-24 NOTE — Progress Notes (Unsigned)
Received a call from Dr. Donne Hazel that the patient's tumor had an invasive component. It is difficult to estimate the size. Apparently there was a large area of ductal carcinoma in situ we scattered nests of invasive disease. Whether this is described as T1 or T3, she is node negative and we are sending an Oncotype. Accordingly she will see me in approximately 3 weeks to discuss those results.

## 2014-12-25 ENCOUNTER — Telehealth: Payer: Self-pay | Admitting: *Deleted

## 2014-12-25 NOTE — Progress Notes (Unsigned)
Called [facility](Melissa) to verify pt's insurance card number for billing purposes for Oncotype testing.

## 2014-12-25 NOTE — Telephone Encounter (Signed)
Received order from Dr. Doris Cheadle for oncotype testing. Requisition sent to pathology. Received by Tammy.

## 2014-12-25 NOTE — Progress Notes (Signed)
Location of Breast Cancer:Right breast DCIS  Histology per Pathology Report:12/21/14 FINAL DIAGNOSIS Diagnosis 1. Breast, simple mastectomy, Right - INVASIVE DUCTAL CARCINOMA, GRADE I/III, SPANNING 8.0 CM. - DUCTAL CARCINOMA IN SITU WITH CALCIFICATIONS, HIGH GRADE. - LOBULAR NEOPLASIA (LOBULAR CARCINOMA IN SITU). - DUCTAL CARCINOMA IN SITU IS BROADLY PRESENT AT THE ANTERIOR SOFT TISSUE RESECTION MARGIN, AND FOCALLY LESS THAN 0.1 CM TO THE POSTERIOR MARGIN. - THERE IS NO EVIDENCE OF CARCINOMA IN 6 OF 6 LYMPH NODES (0/6). - SEE ONCOLOGY TABLE BELOW. 2. Lymph node, sentinel, biopsy, Right axillary - THERE IS NO EVIDENCE OF CARCINOMA IN 1 OF 1 LYMPH NODE (0/1).  Receptor Status: ER(+), PR (+), Her2-neu (-)  Did patient present with symptoms (if so, please note symptoms) or was this found on screening mammography?:  Past/Anticipated interventions by surgeon, if MGY:YOCHV skin sparing mastectomy with sentinel node, reconstruction with expander and acellular dermis by [place].  Past/Anticipated interventions by medical oncology, if any: Chemotherapy  Lymphedema issues, if any: No  Pain issues, if GWG:YFEETOLN  SAFETY ISSUES:  Prior radiation? No   Pacemaker/ICD?NO   Possible current pregnancy?Last menstrual cycle mid March 2016.Has not been sexually active.  Is the patient on methotrexate?   Current Complaints / other details:Married 18 years.First child age 85 menarche age 1, gravida 3 Para 2, contraceptive implant.  NKDA No smoking history No family history of breast or female cancer.    Arlyss Repress, RN 12/25/2014,3:40 PM

## 2014-12-31 ENCOUNTER — Ambulatory Visit
Admission: RE | Admit: 2014-12-31 | Discharge: 2014-12-31 | Disposition: A | Discharge status: Home / Self Care | Payer: BLUE CROSS/BLUE SHIELD | Source: Ambulatory Visit | Attending: Radiation Oncology | Admitting: Radiation Oncology

## 2014-12-31 ENCOUNTER — Encounter: Payer: Self-pay | Admitting: Adult Health

## 2014-12-31 ENCOUNTER — Encounter: Payer: Self-pay | Admitting: *Deleted

## 2014-12-31 ENCOUNTER — Encounter: Payer: Self-pay | Admitting: Radiation Oncology

## 2014-12-31 VITALS — BP 121/76 | HR 100 | Temp 98.1°F | Wt 164.0 lb

## 2014-12-31 DIAGNOSIS — C50411 Malignant neoplasm of upper-outer quadrant of right female breast: Secondary | ICD-10-CM

## 2014-12-31 DIAGNOSIS — D0511 Intraductal carcinoma in situ of right breast: Secondary | ICD-10-CM

## 2014-12-31 NOTE — Progress Notes (Signed)
I briefly met with Ms. Gaspar Garbe during her Radiation Oncology consultation today with Dr. Pablo Ledger. We discussed the purpose of the Survivorship Clinic, which will include monitoring for recurrence, coordinating completion of age and gender-appropriate cancer screenings, promotion of overall wellness, as well as managing potential late/long-term side effects of anti-cancer treatments.    As of today, the intent of treatment for Ms. Ground is cure. Therefore, she will be eligible for the Survivorship Clinic upon her completion of treatment.  Her survivorship care plan (SCP) document will be drafted and updated throughout the course of her treatment trajectory. She will receive the SCP in an office visit with myself in the Survivorship Clinic once she has completed treatment.   Ms. Severtson was encouraged to ask questions and all questions were answered to her satisfaction.  She was given my business card and encouraged to contact me with any concerns regarding survivorship.  I look forward to participating in her care.   Mike Craze, NP [place] [phone]

## 2014-12-31 NOTE — Progress Notes (Signed)
Met with pt post op during visit with Dr. Pablo Ledger. Discussed oncotype ordering and when results will be back. Informed pt that as long as her results are back we will get her in to see Dr. Jana Hakim on Thursday or Friday of next week. Denies needs or questions at this time. Encourage pt to call with concerns. Received verbal understanding.

## 2014-12-31 NOTE — Progress Notes (Signed)
Radiation Oncology         [phone] ________________________________  Initial outpatient Consultation - Date: 12/31/2014   Name: Loretta Sanchez MRN: [id number]   DOB: Apr 23, 1976  REFERRING PHYSICIAN: Crist Infante, MD  DIAGNOSIS:    ICD-9-CM ICD-10-CM   1. 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 female  Who had some pain in her right breast and spontaneous nipple discharge in the right nipple.  She had a mammogram and ultrasound on 3/1 which showed a 7 cm area of pleomorphic calcifications. Dilated ducts were seen on ultrasound. 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 This was 100% ER+, 20% PR+. MRI confirmed a large area of DCIS extending over 9 cm with no abnormal nodes. The left breast was normal. She under went a skin sparing mastectomy and immediate reconstruction on 4/11 which revealed an 8 cm area of invasive cancer with a close posterior and broadly positive margin for DCIS. A single sentinel node was negative. She has recovered well from her surgery. An oncotype has been sent. Genetic testing was normal. She is scheduled to meet with Dr. Lewayne Bunting today and Dr. Donne Hazel Monday. Dr. Donne Hazel has discussed re-excision of this area with her. He referred her to see me for consideration of radiation in the management of her disease.   She is accompanied by her husband and mother. She is GxP3 with menarche at 73. She homeschools her children. She is using implantable contraception as she is still menstruating.   PREVIOUS RADIATION THERAPY: No  Past medical, social and family history were reviewed in  the electronic chart. Review of symptoms was reviewed in the electronic chart. Medications were reviewed in the electronic chart.   PHYSICAL EXAM:  Filed Vitals:   12/31/14 1001  BP: 121/76  Pulse: 100  Temp: 98.1 F (36.7 C)  .164 lb (74.39 kg). Pleasant female in no distress. S/p mastectomy with healing incision over the left chest wall. She is alert and oriented. She has limited range of motion of her right arm and still has 1 drain in place.   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   PLAN: I spoke to the patient today regarding her diagnosis and options for treatment. We discussed the equivalence in terms of survival and local failure between mastectomy and breast conservation. We discussed the role of radiation in decreasing local failures in patients who undergo mastectomy and have risk factors for recurrence including positive lymph nodes and/or tumors over 5 cm and/or positive margins.  We discussed re-excision of this margin which Dr. Donne Hazel believes he can localize.  As she does appear to have more and more advanced (invasive) disease than we knew about at the time of surgery, I think re-excision is prudent.  The effect of margin status with mastectomy and DCIS is unknown but is thought not to be as predictive as margin status with invasive disease.  The failure rates with close margins and no radiation in patients with invasive disease can  approach 40-50%. In patients with T3N0 invasive disease who received chemotherapy and no radiation, the 10 year local failure rate can approach 5% of less. Without chemotherapy and without radiation and only anti-estrogen therapy, this can approach up to 15-20%. For this reason, I have recommended excision of this rea followed by radiation to the chest wall. We discussed that this can occur as soon as her drains are out and she has regained arm motion or we  can wait to place her final implant as long as that process will take less than 3 months. She will discuss that with plastic surgery today.  We discussed the process of simulation and the placement tattoos. We discussed 6 weeks of treatment as an outpatient. We discussed the possibility of asymptomatic lung damage. We discussed the low likelihood of secondary malignancies. We discussed the possible side effects including but not limited to skin redness, fatigue, permanent skin darkening, and chest wall swelling. We discussed increased complications that can occur with reconstruction after radiation.   An oncotype is pending and will return soon and I anticipate will show a low recurrence score. We can start radiation as early as a month after her re-excision. If Sr. Thimeppa can get her permanent implant in within the next 67-54 weeks, we can certainly delay radiation until that is completed. If not, we should proceed with radiation with the expander in and delay permanent implant placement until after radiation is complete.   She met with our breast cancer navigator and our survivorship navigator.    I spent 40 minutes  face to face with the patient and more than 50% of that time was spent in counseling and/or coordination of care.   ------------------------------------------------  Thea Silversmith, MD

## 2015-01-06 ENCOUNTER — Telehealth: Payer: Self-pay | Admitting: *Deleted

## 2015-01-06 NOTE — Telephone Encounter (Signed)
Called to r/s f/u appt with Dr. Jana Hakim d/t oncotype testing score will not be available on appt date. R/s to 5/11 at 9:00AM. Pt denies further needs. Encourage pt to call with questions or concerns.

## 2015-01-07 ENCOUNTER — Ambulatory Visit: Payer: BLUE CROSS/BLUE SHIELD | Admitting: Oncology

## 2015-01-11 ENCOUNTER — Ambulatory Visit: Payer: BLUE CROSS/BLUE SHIELD | Admitting: Oncology

## 2015-01-13 ENCOUNTER — Encounter (HOSPITAL_COMMUNITY): Payer: Self-pay | Admitting: General Surgery

## 2015-01-15 ENCOUNTER — Ambulatory Visit: Payer: BLUE CROSS/BLUE SHIELD | Admitting: Oncology

## 2015-01-20 ENCOUNTER — Ambulatory Visit (HOSPITAL_BASED_OUTPATIENT_CLINIC_OR_DEPARTMENT_OTHER): Payer: BLUE CROSS/BLUE SHIELD | Admitting: Oncology

## 2015-01-20 VITALS — BP 135/73 | HR 76 | Temp 97.9°F | Resp 18 | Ht 69.0 in | Wt 162.5 lb

## 2015-01-20 DIAGNOSIS — C50411 Malignant neoplasm of upper-outer quadrant of right female breast: Secondary | ICD-10-CM

## 2015-01-20 DIAGNOSIS — D0511 Intraductal carcinoma in situ of right breast: Secondary | ICD-10-CM

## 2015-01-20 DIAGNOSIS — Z808 Family history of malignant neoplasm of other organs or systems: Secondary | ICD-10-CM | POA: Diagnosis not present
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
## 2015-01-21 ENCOUNTER — Encounter (HOSPITAL_COMMUNITY): Payer: Self-pay

## 2015-01-21 ENCOUNTER — Telehealth: Payer: Self-pay | Admitting: *Deleted

## 2015-01-21 NOTE — Telephone Encounter (Signed)
Spoke to pt concerning her conversation with Dr. Jana Hakim yesterday. Relate she is very happy not to need chemotherapy. Confirmed surgery date on 5/16 with Dr. Donne Hazel.

## 2015-01-22 ENCOUNTER — Encounter (HOSPITAL_COMMUNITY): Payer: Self-pay | Admitting: *Deleted

## 2015-01-22 NOTE — Progress Notes (Signed)
Called Dr. Cristal Generous office to request orders be put in [facility]. Spoke with Jearld Fenton and she will get a message to Dr. Donne Hazel.

## 2015-01-25 ENCOUNTER — Ambulatory Visit (HOSPITAL_COMMUNITY): Payer: BLUE CROSS/BLUE SHIELD | Admitting: Anesthesiology

## 2015-01-25 ENCOUNTER — Ambulatory Visit (HOSPITAL_COMMUNITY)
Admission: RE | Admit: 2015-01-25 | Discharge: 2015-01-25 | Disposition: A | Discharge status: Home / Self Care | Payer: BLUE CROSS/BLUE SHIELD | Source: Ambulatory Visit | Attending: General Surgery | Admitting: General Surgery

## 2015-01-25 ENCOUNTER — Encounter (HOSPITAL_COMMUNITY)
Admission: RE | Disposition: A | Discharge status: Home / Self Care | Payer: Self-pay | Source: Ambulatory Visit | Attending: General Surgery

## 2015-01-25 ENCOUNTER — Encounter (HOSPITAL_COMMUNITY): Payer: Self-pay | Admitting: Certified Registered Nurse Anesthetist

## 2015-01-25 DIAGNOSIS — Z888 Allergy status to other drugs, medicaments and biological substances status: Secondary | ICD-10-CM | POA: Diagnosis not present

## 2015-01-25 DIAGNOSIS — Z170 Estrogen receptor positive status [ER+]: Secondary | ICD-10-CM | POA: Diagnosis not present

## 2015-01-25 DIAGNOSIS — G43909 Migraine, unspecified, not intractable, without status migrainosus: Secondary | ICD-10-CM | POA: Diagnosis not present

## 2015-01-25 DIAGNOSIS — C50911 Malignant neoplasm of unspecified site of right female breast: Secondary | ICD-10-CM | POA: Diagnosis not present

## 2015-01-25 DIAGNOSIS — Z9011 Acquired absence of right breast and nipple: Secondary | ICD-10-CM | POA: Diagnosis not present

## 2015-01-25 HISTORY — PX: RE-EXCISION OF BREAST CANCER,SUPERIOR MARGINS: SHX6047

## 2015-01-25 LAB — BASIC METABOLIC PANEL
Anion gap: 8 (ref 5–15)
BUN: 8 mg/dL (ref 6–20)
CO2: 27 mmol/L (ref 22–32)
CREATININE: 0.98 mg/dL (ref 0.44–1.00)
Calcium: 9.1 mg/dL (ref 8.9–10.3)
Chloride: 106 mmol/L (ref 101–111)
GFR calc Af Amer: >60 mL/min (ref >60)
Glucose, Bld: 102 mg/dL — ABNORMAL HIGH (ref 65–99)
Potassium: 3.7 mmol/L (ref 3.5–5.1)
Sodium: 141 mmol/L (ref 135–145)

## 2015-01-25 LAB — CBC
HCT: 33.8 % — ABNORMAL LOW (ref 36.0–46.0)
Hemoglobin: 11.3 g/dL — ABNORMAL LOW (ref 12.0–15.0)
MCH: 30.9 pg (ref 26.0–34.0)
MCHC: 33.4 g/dL (ref 30.0–36.0)
MCV: 92.3 fL (ref 78.0–100.0)
PLATELETS: 195 10*3/uL (ref 150–400)
RBC: 3.66 MIL/uL — ABNORMAL LOW (ref 3.87–5.11)
RDW: 12.1 % (ref 11.5–15.5)
WBC: 4.7 10*3/uL (ref 4.0–10.5)

## 2015-01-25 LAB — HCG, SERUM, QUALITATIVE: Preg, Serum: NEGATIVE

## 2015-01-25 SURGERY — RE-EXCISION OF BREAST CANCER,SUPERIOR MARGINS
Anesthesia: General | Site: Breast | Laterality: Right

## 2015-01-25 MED ORDER — LIDOCAINE HCL (CARDIAC) 20 MG/ML IV SOLN
INTRAVENOUS | Status: DC | PRN
  Administered 2015-01-25: 80 mg via INTRAVENOUS

## 2015-01-25 MED ORDER — FENTANYL CITRATE (PF) 250 MCG/5ML IJ SOLN
INTRAMUSCULAR | Status: AC
  Filled 2015-01-25: qty 5

## 2015-01-25 MED ORDER — OXYCODONE HCL 5 MG PO TABS: 5.0000 mg | ORAL_TABLET | Freq: Once | ORAL | Status: DC | PRN

## 2015-01-25 MED ORDER — SUCCINYLCHOLINE CHLORIDE 20 MG/ML IJ SOLN
INTRAMUSCULAR | Status: AC
  Filled 2015-01-25: qty 1

## 2015-01-25 MED ORDER — ONDANSETRON HCL 4 MG/2ML IJ SOLN
INTRAMUSCULAR | Status: AC
  Filled 2015-01-25: qty 2

## 2015-01-25 MED ORDER — MIDAZOLAM HCL 5 MG/5ML IJ SOLN
INTRAMUSCULAR | Status: DC | PRN
  Administered 2015-01-25: 2 mg via INTRAVENOUS

## 2015-01-25 MED ORDER — PROPOFOL 10 MG/ML IV BOLUS
INTRAVENOUS | Status: AC
  Filled 2015-01-25: qty 20

## 2015-01-25 MED ORDER — FENTANYL CITRATE (PF) 100 MCG/2ML IJ SOLN
INTRAMUSCULAR | Status: DC | PRN
  Administered 2015-01-25 (×2): 50 ug via INTRAVENOUS

## 2015-01-25 MED ORDER — DIPHENHYDRAMINE HCL 50 MG/ML IJ SOLN
INTRAMUSCULAR | Status: AC
  Filled 2015-01-25: qty 1

## 2015-01-25 MED ORDER — BUPIVACAINE HCL 0.25 % IJ SOLN: INTRAMUSCULAR | Status: DC | PRN

## 2015-01-25 MED ORDER — ONDANSETRON HCL 4 MG/2ML IJ SOLN
INTRAMUSCULAR | Status: DC | PRN
  Administered 2015-01-25: 4 mg via INTRAVENOUS

## 2015-01-25 MED ORDER — 0.9 % SODIUM CHLORIDE (POUR BTL) OPTIME
TOPICAL | Status: DC | PRN
  Administered 2015-01-25: 1000 mL

## 2015-01-25 MED ORDER — LACTATED RINGERS IV SOLN
INTRAVENOUS | Status: DC | PRN
  Administered 2015-01-25: 08:00:00 via INTRAVENOUS

## 2015-01-25 MED ORDER — DEXAMETHASONE SODIUM PHOSPHATE 4 MG/ML IJ SOLN
INTRAMUSCULAR | Status: AC
  Filled 2015-01-25: qty 1

## 2015-01-25 MED ORDER — SCOPOLAMINE 1 MG/3DAYS TD PT72
1.0000 | MEDICATED_PATCH | TRANSDERMAL | Status: DC
  Administered 2015-01-25: 1.5 mg via TRANSDERMAL

## 2015-01-25 MED ORDER — SODIUM CHLORIDE 0.9 % IR SOLN
Status: DC | PRN
  Administered 2015-01-25: 500 mL

## 2015-01-25 MED ORDER — LIDOCAINE HCL (CARDIAC) 20 MG/ML IV SOLN
INTRAVENOUS | Status: AC
  Filled 2015-01-25: qty 5

## 2015-01-25 MED ORDER — DIPHENHYDRAMINE HCL 50 MG/ML IJ SOLN
INTRAMUSCULAR | Status: DC | PRN
  Administered 2015-01-25: 12.5 mg via INTRAVENOUS

## 2015-01-25 MED ORDER — OXYCODONE HCL 5 MG/5ML PO SOLN: 5.0000 mg | Freq: Once | ORAL | Status: DC | PRN

## 2015-01-25 MED ORDER — CEFAZOLIN SODIUM-DEXTROSE 2-3 GM-% IV SOLR
INTRAVENOUS | Status: AC
  Administered 2015-01-25: 2 g via INTRAVENOUS
  Filled 2015-01-25: qty 50

## 2015-01-25 MED ORDER — PROPOFOL 10 MG/ML IV BOLUS
INTRAVENOUS | Status: DC | PRN
  Administered 2015-01-25: 180 mg via INTRAVENOUS

## 2015-01-25 MED ORDER — HYDROMORPHONE HCL 1 MG/ML IJ SOLN: 0.2500 mg | INTRAMUSCULAR | Status: DC | PRN

## 2015-01-25 MED ORDER — BUPIVACAINE HCL (PF) 0.25 % IJ SOLN
INTRAMUSCULAR | Status: AC
  Filled 2015-01-25: qty 30

## 2015-01-25 MED ORDER — STERILE WATER FOR INJECTION IJ SOLN
INTRAMUSCULAR | Status: AC
Start: 2015-01-25 — End: 2015-01-25
  Filled 2015-01-25: qty 10

## 2015-01-25 MED ORDER — ONDANSETRON HCL 4 MG/2ML IJ SOLN: 4.0000 mg | Freq: Four times a day (QID) | INTRAMUSCULAR | Status: DC | PRN

## 2015-01-25 MED ORDER — SCOPOLAMINE 1 MG/3DAYS TD PT72
MEDICATED_PATCH | TRANSDERMAL | Status: AC
  Filled 2015-01-25: qty 1

## 2015-01-25 MED ORDER — ROCURONIUM BROMIDE 50 MG/5ML IV SOLN
INTRAVENOUS | Status: AC
  Filled 2015-01-25: qty 1

## 2015-01-25 MED ORDER — MIDAZOLAM HCL 2 MG/2ML IJ SOLN
INTRAMUSCULAR | Status: AC
  Filled 2015-01-25: qty 2

## 2015-01-25 MED ORDER — DEXAMETHASONE SODIUM PHOSPHATE 4 MG/ML IJ SOLN
INTRAMUSCULAR | Status: DC | PRN
  Administered 2015-01-25: 4 mg via INTRAVENOUS

## 2015-01-25 MED ORDER — EPHEDRINE SULFATE 50 MG/ML IJ SOLN
INTRAMUSCULAR | Status: AC
Start: 2015-01-25 — End: 2015-01-25
  Filled 2015-01-25: qty 1

## 2015-01-25 MED ORDER — ARTIFICIAL TEARS OP OINT
TOPICAL_OINTMENT | OPHTHALMIC | Status: AC
  Filled 2015-01-25: qty 3.5

## 2015-01-25 SURGICAL SUPPLY — 55 items
BAG DECANTER FOR FLEXI CONT (MISCELLANEOUS) ×2 IMPLANT
BENZOIN TINCTURE PRP APPL 2/3 (GAUZE/BANDAGES/DRESSINGS) IMPLANT
BINDER BREAST LRG (GAUZE/BANDAGES/DRESSINGS) ×2 IMPLANT
BLADE SURG 10 STRL SS (BLADE) ×2 IMPLANT
BLADE SURG 15 STRL LF DISP TIS (BLADE) ×1 IMPLANT
BLADE SURG 15 STRL SS (BLADE) ×1
BLADE SURG ROTATE 9660 (MISCELLANEOUS) IMPLANT
CANISTER SUCTION 2500CC (MISCELLANEOUS) ×2 IMPLANT
CHLORAPREP W/TINT 26ML (MISCELLANEOUS) ×2 IMPLANT
CONT SPEC 4OZ CLIKSEAL STRL BL (MISCELLANEOUS) ×2 IMPLANT
COVER SURGICAL LIGHT HANDLE (MISCELLANEOUS) ×2 IMPLANT
DECANTER SPIKE VIAL GLASS SM (MISCELLANEOUS) ×2 IMPLANT
DRAPE PED LAPAROTOMY (DRAPES) ×2 IMPLANT
ELECT CAUTERY BLADE 6.4 (BLADE) ×2 IMPLANT
ELECT REM PT RETURN 9FT ADLT (ELECTROSURGICAL) ×2
ELECTRODE REM PT RTRN 9FT ADLT (ELECTROSURGICAL) ×1 IMPLANT
GLOVE BIO SURGEON STRL SZ 6 (GLOVE) ×2 IMPLANT
GLOVE BIO SURGEON STRL SZ7 (GLOVE) ×2 IMPLANT
GLOVE BIOGEL PI IND STRL 7.0 (GLOVE) ×1 IMPLANT
GLOVE BIOGEL PI IND STRL 7.5 (GLOVE) ×1 IMPLANT
GLOVE BIOGEL PI INDICATOR 7.0 (GLOVE) ×1
GLOVE BIOGEL PI INDICATOR 7.5 (GLOVE) ×1
GLOVE SURG SS PI 7.0 STRL IVOR (GLOVE) ×2 IMPLANT
GLOVE SURG SS PI 7.5 STRL IVOR (GLOVE) ×2 IMPLANT
GOWN STRL REUS W/ TWL LRG LVL3 (GOWN DISPOSABLE) ×4 IMPLANT
GOWN STRL REUS W/TWL LRG LVL3 (GOWN DISPOSABLE) ×4
KIT BASIN OR (CUSTOM PROCEDURE TRAY) ×2 IMPLANT
KIT ROOM TURNOVER OR (KITS) ×2 IMPLANT
LIQUID BAND (GAUZE/BANDAGES/DRESSINGS) ×2 IMPLANT
NEEDLE 21 GA WING INFUSION (NEEDLE) ×2 IMPLANT
NEEDLE HYPO 25GX1X1/2 BEV (NEEDLE) ×2 IMPLANT
NS IRRIG 1000ML POUR BTL (IV SOLUTION) ×2 IMPLANT
PACK SURGICAL SETUP 50X90 (CUSTOM PROCEDURE TRAY) ×2 IMPLANT
PAD ABD 8X10 STRL (GAUZE/BANDAGES/DRESSINGS) ×2 IMPLANT
PAD ARMBOARD 7.5X6 YLW CONV (MISCELLANEOUS) ×2 IMPLANT
PENCIL BUTTON HOLSTER BLD 10FT (ELECTRODE) ×2 IMPLANT
SET ASEPTIC TRANSFER (MISCELLANEOUS) ×2 IMPLANT
SPONGE LAP 4X18 X RAY DECT (DISPOSABLE) ×2 IMPLANT
STRIP CLOSURE SKIN 1/2X4 (GAUZE/BANDAGES/DRESSINGS) IMPLANT
SUT ETHILON 4 0 PS 2 18 (SUTURE) ×4 IMPLANT
SUT MON AB 5-0 PS2 18 (SUTURE) IMPLANT
SUT SILK 2 0 SH (SUTURE) ×2 IMPLANT
SUT VIC AB 2-0 SH 27 (SUTURE)
SUT VIC AB 2-0 SH 27XBRD (SUTURE) IMPLANT
SUT VIC AB 3-0 SH 27 (SUTURE) ×1
SUT VIC AB 3-0 SH 27X BRD (SUTURE) ×1 IMPLANT
SUT VIC AB 4-0 PS2 27 (SUTURE) ×2 IMPLANT
SYR BULB 3OZ (MISCELLANEOUS) ×2 IMPLANT
SYR BULB IRRIGATION 50ML (SYRINGE) ×2 IMPLANT
SYR CONTROL 10ML LL (SYRINGE) ×2 IMPLANT
TAPE PAPER MEDFIX 1IN X 10YD (GAUZE/BANDAGES/DRESSINGS) ×2 IMPLANT
TOWEL OR 17X24 6PK STRL BLUE (TOWEL DISPOSABLE) ×2 IMPLANT
TOWEL OR 17X26 10 PK STRL BLUE (TOWEL DISPOSABLE) ×2 IMPLANT
TUBE CONNECTING 12X1/4 (SUCTIONS) ×4 IMPLANT
YANKAUER SUCT BULB TIP NO VENT (SUCTIONS) ×4 IMPLANT

## 2015-01-25 NOTE — Anesthesia Postprocedure Evaluation (Signed)
Anesthesia Post Note  Patient: Loretta Sanchez  Procedure(s) Performed: Procedure(s) (LRB): EXCISION OF SKIN AND SOFT TISSUE RIGHT BREAST WITH TISSUE EXPANSION (Right)  Anesthesia type: General  Patient location: PACU  Post pain: Pain level controlled and Adequate analgesia  Post assessment: Post-op Vital signs reviewed, Patient's Cardiovascular Status Stable, Respiratory Function Stable, Patent Airway and Pain level controlled  Last Vitals:  Filed Vitals:   01/25/15 0919  BP: 129/78  Pulse: 89  Temp:   Resp: 18    Post vital signs: Reviewed and stable  Level of consciousness: awake, alert  and oriented  Complications: No apparent anesthesia complications

## 2015-01-25 NOTE — Transfer of Care (Signed)
Immediate Anesthesia Transfer of Care Note  Patient: Loretta Sanchez  Procedure(s) Performed: Procedure(s): EXCISION OF SKIN AND SOFT TISSUE RIGHT BREAST WITH TISSUE EXPANSION (Right)  Patient Location: PACU  Anesthesia Type:General  Level of Consciousness: awake and alert   Airway & Oxygen Therapy: Patient Spontanous Breathing and Patient connected to nasal cannula oxygen  Post-op Assessment: Report given to RN and Post -op Vital signs reviewed and stable  Post vital signs: Reviewed and stable  Last Vitals:  Filed Vitals:   01/25/15 0620  BP: 139/86  Pulse: 75  Temp: 36.5 C  Resp: 18    Complications: No apparent anesthesia complications

## 2015-01-25 NOTE — Discharge Instructions (Signed)
May remove dressing and shower in 48 hours. [facility], [place] [phone]  POST OP INSTRUCTIONS  Always review your discharge instruction sheet given to you by the facility where your surgery was performed. IF YOU HAVE DISABILITY OR FAMILY LEAVE FORMS, YOU MUST BRING THEM TO THE OFFICE FOR PROCESSING.   DO NOT GIVE THEM TO YOUR DOCTOR. A prescription for pain medication may be given to you upon discharge.  Take your pain medication as prescribed, if needed.  If narcotic pain medicine is not needed, then you may take acetaminophen (Tylenol), naprosyn (Alleve) or ibuprofen (Advil) as needed. 1. Take your usually prescribed medications unless otherwise directed. 2. If you need a refill on your pain medication, please contact your pharmacy.  They will contact our office to request authorization.  Prescriptions will not be filled after 5pm or on week-ends. 3.  Resume your normal diet the day after surgery. 4. Most patients will experience some swelling and bruising on the chest. Ice packs will help.  Swelling and bruising can take several days to resolve. Wear the binder day and night until you return to the office.  5. It is common to experience some constipation if taking pain medication after surgery.  Increasing fluid intake and taking a stool softener (such as Colace) will usually help or prevent this problem from occurring.  A mild laxative (Milk of Magnesia or Miralax) should be taken according to package instructions if there are no bowel movements after 48 hours. 6.  If you have glue it will come off in next couple weeks.  Any sutures will be removed at an office visit 7. ACTIVITIES:  You may resume regular (light) daily activities beginning the next day--such as daily self-care, walking, climbing stairs--gradually increasing activities as tolerated.  You may have sexual intercourse when it is comfortable.  Refrain from any heavy lifting or straining until approved by your  doctor. a. You may drive when you are no longer taking prescription pain medication, you can comfortably wear a seatbelt, and you can safely maneuver your car and apply brakes. b. RETURN TO WORK:  __________________________________________________________ 8. You should see your doctor in the office for a follow-up appointment approximately 3-5 days after your surgery.  Your doctors nurse will typically make your follow-up appointment when she calls you with your pathology report.  Expect your pathology report 3-4business days after surgery. 9. OTHER INSTRUCTIONS: ______________________________________________________________________________________________ ____________________________________________________________________________________________ WHEN TO CALL YOUR DR Yoanna Jurczyk: 1. Fever over 101.0 2. Nausea and/or vomiting 3. Extreme swelling or bruising 4. Continued bleeding from incision. 5. Increased pain, redness, or drainage from the incision. The clinic staff is available to answer your questions during regular business hours.  Please dont hesitate to call and ask to speak to one of the nurses for clinical concerns.  If you have a medical emergency, go to the nearest emergency room or call 911.  A surgeon from [facility] is always on call at the hospital. [address], [place], [place], [place]  [address] ? [address], [place],    [address] [phone] ? [phone] ? FAX [phone] Web site: [email or web address]

## 2015-01-25 NOTE — Op Note (Addendum)
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 i have reviewed the specimen with pathology and know exactly where the dcis is at the anterior margin. She had an oncotype attempted but there was insufficient tissue times two.  This is larger area with multiple areas of invasive cancer.  We discussed returning to excise this skin and the margin.  Procedure: After informed consent was obtained patient was taken to the operating room.  Antibiotics were given.  SCDs were in place.  She was placed under general anesthesia without complication.  She was prepped and draped in the standard sterile surgical fashion. A timeout was performed.  I then marked out an area measuring about 6x3 cm including the lateral portion of her scar and extending laterally. I then made incision.  This was then removed with cautery.  Antibiotic irrigation was performed.  There was a medial superficial skin breakdown that was also debrided.  Both of these were closed with a running 4-0 nylon suture.  Dr Iran Planas expanded her with 70 cc saline also.  Dermabond was applied. Dressings applied.  She was extubated and transferred to recovery stable

## 2015-01-25 NOTE — Interval H&P Note (Signed)
History and Physical Interval Note:  01/25/2015 7:29 AM  Loretta Sanchez  has presented today for surgery, with the diagnosis of BREAST CANCER  The various methods of treatment have been discussed with the patient and family. After consideration of risks, benefits and other options for treatment, the patient has consented to  Procedure(s): EXCISION SKIN AND SUBCUTANEOUS TISSUE RIGHT BREAST (Right) as a surgical intervention .  The patient's history has been reviewed, patient examined, no change in status, stable for surgery.  I have reviewed the patient's chart and labs.  Questions were answered to the patient's satisfaction.     Willi Borowiak

## 2015-01-25 NOTE — Anesthesia Procedure Notes (Signed)
Procedure Name: LMA Insertion Date/Time: 01/25/2015 7:35 AM Performed by: Maryland Pink Pre-anesthesia Checklist: Patient identified, Timeout performed, Emergency Drugs available, Suction available and Patient being monitored Patient Re-evaluated:Patient Re-evaluated prior to inductionOxygen Delivery Method: Circle system utilized Preoxygenation: Pre-oxygenation with 100% oxygen Intubation Type: IV induction LMA: LMA inserted LMA Size: 4.0 Number of attempts: 1 Placement Confirmation: positive ETCO2 and breath sounds checked- equal and bilateral Tube secured with: Tape Dental Injury: Teeth and Oropharynx as per pre-operative assessment

## 2015-01-25 NOTE — Anesthesia Preprocedure Evaluation (Signed)
Anesthesia Evaluation  Patient identified by MRN, date of birth, ID band Patient awake    Reviewed: Allergy & Precautions, NPO status , Patient's Chart, lab work & pertinent test results  Airway Mallampati: II   Neck ROM: full    Dental   Pulmonary neg pulmonary ROS,  breath sounds clear to auscultation        Cardiovascular negative cardio ROS  Rhythm:regular Rate:Normal     Neuro/Psych  Headaches,    GI/Hepatic   Endo/Other    Renal/GU      Musculoskeletal   Abdominal   Peds  Hematology   Anesthesia Other Findings   Reproductive/Obstetrics H/o breast CA                             Anesthesia Physical Anesthesia Plan  ASA: II  Anesthesia Plan: General   Post-op Pain Management:    Induction: Intravenous  Airway Management Planned: LMA  Additional Equipment:   Intra-op Plan:   Post-operative Plan:   Informed Consent: I have reviewed the patients History and Physical, chart, labs and discussed the procedure including the risks, benefits and alternatives for the proposed anesthesia with the patient or authorized representative who has indicated his/her understanding and acceptance.     Plan Discussed with: CRNA, Anesthesiologist and Surgeon  Anesthesia Plan Comments:         Anesthesia Quick Evaluation

## 2015-01-25 NOTE — H&P (Signed)
Loretta Sanchez is an 39 y.o. female.   Chief Complaint: right breast cancer HPI:  22 yof who appeared preop to have dcis and underwent right ssm with sentinel node biopsy. the path ends up being an 8 cm idc that is er/pr positive, ki is 42%, her 2 not amplified. The anterior margin is positive and the posterior margin is close. The anterior margin has been reviewed by me and the pathologist. I know exactly where this is after reviewing the specimen grossly. Oncotype insufficient. Decision made for no chemo.     Past Medical History  Diagnosis Date  . Chronic vaginitis   . Vulvar vestibulitis   . Migraines   . Cancer     right breast cancer  . Heart murmur     as an infant  . Family history of adverse reaction to anesthesia     mom has n/v and difficulty waking up    Past Surgical History  Procedure Laterality Date  . Wisdom tooth extraction    . Dilation and curettage of uterus    . Simple mastectomy with axillary sentinel node biopsy Right 12/21/2014    Procedure: RIGHT SKIN SPARING MASTECTOMY WITH RIGHT  AXILLARY SENTINEL LYMPH NODE BIOPSY;  Surgeon: Rolm Bookbinder, MD;  Location: [facility];  Service: General;  Laterality: Right;  . Breast reconstruction with placement of tissue expander and flex hd (acellular hydrated dermis) Right 12/21/2014    Procedure: RIGHT  BREAST RECONSTRUCTION WITH PLACEMENT OF TISSUE EXPANDER AND  ACELLULAR DERMIS;  Surgeon: Irene Limbo, MD;  Location: [facility];  Service: Plastics;  Laterality: Right;    Family History  Problem Relation Age of Onset  . Hypertension Mother   . Hypertension Father   . Hypertension Maternal Grandmother   . Cancer Maternal Grandmother 81    primary stomach cancer  . Hypertension Maternal Grandfather   . Hypertension Paternal Grandmother   . Hypertension Paternal Grandfather   . Diabetes Paternal Grandfather   . Cancer Maternal Uncle 39    primary bone cancer  . Cancer Other 22    mat great aunt (MGM sister)  with primary stomach cancer   Social History:  reports that she has never smoked. She has never used smokeless tobacco. She reports that she does not drink alcohol or use illicit drugs.  Allergies:  Allergies  Allergen Reactions  . Other Rash    Steri-strips    Medications Prior to Admission  Medication Sig Dispense Refill  . cetirizine (ZYRTEC) 10 MG tablet Take 10 mg by mouth daily as needed for allergies.    . diazepam (VALIUM) 5 MG tablet Take 1 tablet (5 mg total) by mouth every 8 (eight) hours as needed for anxiety or muscle spasms. 30 tablet 0  . ibuprofen (ADVIL,MOTRIN) 200 MG tablet Take 600 mg by mouth every 6 (six) hours as needed for headache or moderate pain.     . Multiple Vitamins-Minerals (MULTIVITAMIN WITH MINERALS) tablet Take 1 tablet by mouth daily.    . traMADol (ULTRAM) 50 MG tablet Take 50 mg by mouth every 6 (six) hours as needed for moderate pain.      Results for orders placed or performed during the hospital encounter of 01/25/15 (from the past 48 hour(s))  CBC     Status: Abnormal   Collection Time: 01/25/15  6:53 AM  Result Value Ref Range   WBC 4.7 4.0 - 10.5 K/uL   RBC 3.66 (L) 3.87 - 5.11 MIL/uL   Hemoglobin 11.3 (L)  12.0 - 15.0 g/dL   HCT 33.8 (L) 36.0 - 46.0 %   MCV 92.3 78.0 - 100.0 fL   MCH 30.9 26.0 - 34.0 pg   MCHC 33.4 30.0 - 36.0 g/dL   RDW 12.1 11.5 - 15.5 %   Platelets 195 150 - 400 K/uL   No results found.  ROS negative Blood pressure 139/86, pulse 75, temperature 97.7 F (36.5 C), temperature source Oral, resp. rate 18, height 5\' 9"  (1.753 m), weight 73.483 kg (162 lb), last menstrual period 11/24/2014, SpO2 99 %. Physical Exam  Vitals (Loretta Sanchez CMA; 01/04/2015 8:35 AM) 01/04/2015 8:35 AM Weight: 162 lb Height: 69in Body Surface Area: 1.89 m Body Mass Index: 23.92 kg/m Pulse: 96 (Regular)  BP: 118/78 (Sitting, Left Arm, Standard) Physical Exam Rolm Bookbinder MD; 01/04/2015 11:21 AM) Breast Note: healing  right breast incision without infection, drain in place cv rrr pulm clear bilaterally  Assessment/Plan Assessment & Plan Rolm Bookbinder MD; 01/04/2015 11:23 AM) POSTOPERATIVE STATE (V45.89  Z98.89) Plan for re-excision today  Loretta Sanchez 01/25/2015, 7:28 AM

## 2015-01-27 ENCOUNTER — Encounter (HOSPITAL_COMMUNITY): Payer: Self-pay | Admitting: General Surgery

## 2015-01-29 ENCOUNTER — Encounter (HOSPITAL_COMMUNITY): Payer: Self-pay | Admitting: General Surgery

## 2015-02-02 ENCOUNTER — Telehealth: Payer: Self-pay

## 2015-02-02 NOTE — Telephone Encounter (Signed)
Spoke with patient regarding appointment on 02/17/15.Informed patient Dr.Wentowrth will discuss radiation treatment at that time and answer any questions she may have.

## 2015-02-10 ENCOUNTER — Ambulatory Visit: Payer: Self-pay | Admitting: Radiation Oncology

## 2015-02-12 NOTE — Progress Notes (Signed)
Right breast cancer  Expander placement by Dr.Thimmmappa on 02/15/15.  Re-excision 5/16/16Diagnosis Skin , right breast - BENIGN SKIN AND SUBCUTANEOUS TISSUE WITH HEALING BIOPSY SITE. - BENIGN BREAST PARENCHYMA. - THERE IS NO EVIDENCE OF MALIGNANCY. Diagnosis:12/21/14 1. Breast, simple mastectomy, Right - INVASIVE DUCTAL CARCINOMA, GRADE I/III, SPANNING 8.0 CM. - DUCTAL CARCINOMA IN SITU WITH CALCIFICATIONS, HIGH GRADE. - LOBULAR NEOPLASIA (LOBULAR CARCINOMA IN SITU). - DUCTAL CARCINOMA IN SITU IS BROADLY PRESENT AT THE ANTERIOR SOFT TISSUE RESECTION MARGIN, AND FOCALLY LESS THAN 0.1 CM TO THE POSTERIOR MARGIN. - THERE IS NO EVIDENCE OF CARCINOMA IN 6 OF 6 LYMPH NODES (0/6). - SEE ONCOLOGY TABLE BELOW. 2. Lymph node, sentinel, biopsy, Right axillary - THERE IS NO EVIDENCE OF CARCINOMA IN 1 OF 1 LYMPH NODE (0/1).  Receptor Status: ER(+), PR (+), Her2-neu (-)  Did patient present with symptoms (if so, please note symptoms) or was this found on screening mammography?:Patient had pain and spontaneous discharge from right breast.mammogram and ultrasound showed 7 cm pleomorphic calcifications.  Past/Anticipated interventions by surgeon, if UUV:OZDGU skin sparing mastectomy with sentinel node, reconstruction with expander and acellular dermis by Thimmappa.  Past/Anticipated interventions by medical oncology, if any: Chemotherapy not reccommended. Genetic testing negative. Oncotype score:not enough tissue.  Lymphedema issues, if any: No  Pain issues, if any:No  SAFETY ISSUES:  Prior radiation? No  Pacemaker/ICD?NO  Possible current pregnancy?Last menstrual cycle mid March 2016.Has not been sexually active.  Is the patient on methotrexate?No.    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

## 2015-02-17 ENCOUNTER — Ambulatory Visit
Admission: RE | Admit: 2015-02-17 | Discharge: 2015-02-17 | Disposition: A | Discharge status: Home / Self Care | Payer: BLUE CROSS/BLUE SHIELD | Source: Ambulatory Visit | Attending: Radiation Oncology | Admitting: Radiation Oncology

## 2015-02-17 VITALS — BP 116/72 | HR 85 | Temp 98.3°F | Wt 165.7 lb

## 2015-02-17 DIAGNOSIS — C50411 Malignant neoplasm of upper-outer quadrant of right female breast: Secondary | ICD-10-CM

## 2015-02-17 NOTE — Addendum Note (Signed)
Encounter addended by: Norm Salt, RN on: 02/17/2015  2:35 PM<BR>     Documentation filed: Notes Section

## 2015-02-17 NOTE — Progress Notes (Signed)
Please see the Nurse Progress Note in the MD Initial Consult Encounter for this patient. 

## 2015-02-17 NOTE — Progress Notes (Signed)
Name: Loretta Sanchez   MRN: [id number]  Date:  02/17/2015  DOB: September 15, 1975  Status:outpatient    DIAGNOSIS: Breast cancer.  CONSENT VERIFIED: yes   SET UP: Patient is setup supine   IMMOBILIZATION:  The following immobilization was used:Custom Moldable Pillow, breast board.   NARRATIVE: [name] was brought to the [facility].  Identity was confirmed.  All relevant records and images related to the planned course of therapy were reviewed.  Then, the patient was positioned in a stable reproducible clinical set-up for radiation therapy.  Wires were placed to delineate the clinical extent of breast tissue. A wire was placed on the scar as well.  CT images were obtained.  An isocenter was placed. Skin markings were placed.  The CT images were loaded into the planning software where the target and avoidance structures were contoured.  The radiation prescription was entered and confirmed. The patient was discharged in stable condition and tolerated simulation well.    TREATMENT PLANNING NOTE:  Treatment planning then occurred. I have requested : MLC's, isodose plan, basic dose calculation  I personally designed and supervised the construction of 3 medically necessary complex treatment devices for the protection of critical normal structures including the lungs and contralateral breast as well as the immobilization device which is necessary for set up certainty.   3D simulation occurred. I requested and analyzed a dose volume histogram of the heart, lungs and lumpectomy cavity.   This document serves as a record of services personally performed by Thea Silversmith, MD. It was created on her behalf by Darcus Austin, a trained medical scribe. The creation of this record is based on the scribe's personal observations and the provider's statements to them. This document has been checked and approved by the attending provider.

## 2015-02-17 NOTE — Progress Notes (Signed)
Radiation Oncology         [phone] ________________________________  Name: ANNETT BOXWELL      MRN: [id number]          Date: 02/17/2015              DOB: 1976/02/01  Optical Surface Tracking Plan:  Since intensity modulated radiotherapy (IMRT) and 3D conformal radiation treatment methods are predicated on accurate and precise positioning for treatment, intrafraction motion monitoring is medically necessary to ensure accurate and safe treatment delivery.  The ability to quantify intrafraction motion without excessive ionizing radiation dose can only be performed with optical surface tracking. Accordingly, surface imaging offers the opportunity to obtain 3D measurements of patient position throughout IMRT and 3D treatments without excessive radiation exposure.  I am ordering optical surface tracking for this patient's upcoming course of radiotherapy.  This document serves as a record of services personally performed by Thea Silversmith, MD. It was created on her behalf by Darcus Austin, a trained medical scribe. The creation of this record is based on the scribe's personal observations and the provider's statements to them. This document has been checked and approved by the attending provider.  ________________________________ Signature   Reference:   Particia Jasper, et al. Surface imaging-based analysis of intrafraction motion for breast radiotherapy patients.Journal of [place], n. 6, nov. 2014. ISSN 91694503.   Available at: <http://www.jacmp.org/index.php/jacmp/article/view/4957>.

## 2015-02-17 NOTE — Progress Notes (Signed)
   Department of Radiation Oncology  Phone:  [phone] Fax:        [phone]   Name: MATSUKO KRETZ MRN: [id number]  DOB: [birth year]  Date: 02/17/2015  Follow Up Visit Note  Diagnosis: 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   Staging form: Breast, AJCC 7th Edition     Clinical: Stage 0 (Tis, N0, M0) - Signed by Chauncey Cruel, MD on 11/23/2014     Pathologic: Stage IIB (T3, N0, cM0) - Signed by Thea Silversmith, MD on 12/31/2014  Interval History: Jeanett presents today for routine followup. She had a reexscision on 5/16 and the final pathology showed no evidence of malignancy. The decision was made no to pursue chemo due to insufficient tissue for oncotype testing. After discussion at [facility], we decided to pursue adjuvant radiation to the chest wall. Her last expansion was last week and is recovering. She has healed well from her surgery and is ready to pursue radiation. She is accompanied by her husband today.  Physical Exam:  There were no vitals filed for this visit. Pleasant female appears her stated age. Right expander in place. Surgical incision has healed well with no signs of infection.  IMPRESSION: Loretta Sanchez is a 39 y.o. female with TIIIN0 Invasive Ductal Carcinoma of the Right Breast  PLAN: I spoke to the patient today regarding her diagnosis and options for treatment. We discussed the equivalence in terms of survival and local failure between mastectomy and breast conservation. We discussed the role of radiation in decreasing local failures in patients who undergo mastectomy and have risk factors for recurrence due to a tumor over 5 cm. We discussed the process of simulation and the placement tattoos. We discussed 6 weeks of treatment as an outpatient. We discussed the possibility of asymptomatic lung damage. We discussed the low likelihood of secondary malignancies. We discussed the possible side effects including but not limited to skin redness, fatigue,  permanent skin darkening, and chest wall swelling. We discussed increased complications that can occur with reconstruction after radiation.  We went over the complexities of her case. There is controversy regarding if she has a conglomerate of microscopic disease measuring 8 cm or 8 cm of invasive disease. The most aggressive course would be to continue radiation to the right chest wall especially given the absence of chemotherapy. We discussed that the main downside would be the impact on her implant which she understands  She signed informed consent and is ready for CT sim this afternoon.  This document serves as a record of services personally performed by Thea Silversmith, MD. It was created on her behalf by Darcus Austin, a trained medical scribe. The creation of this record is based on the scribe's personal observations and the provider's statements to them. This document has been checked and approved by the attending provider.   Thea Silversmith, MD

## 2015-02-23 DIAGNOSIS — C50411 Malignant neoplasm of upper-outer quadrant of right female breast: Secondary | ICD-10-CM | POA: Diagnosis not present

## 2015-02-24 ENCOUNTER — Ambulatory Visit: Payer: BLUE CROSS/BLUE SHIELD | Attending: Plastic Surgery | Admitting: Physical Therapy

## 2015-02-24 ENCOUNTER — Ambulatory Visit
Admission: RE | Admit: 2015-02-24 | Discharge: 2015-02-24 | Disposition: A | Discharge status: Home / Self Care | Payer: BLUE CROSS/BLUE SHIELD | Source: Ambulatory Visit | Attending: Radiation Oncology | Admitting: Radiation Oncology

## 2015-02-24 ENCOUNTER — Ambulatory Visit: Payer: BLUE CROSS/BLUE SHIELD

## 2015-02-24 DIAGNOSIS — M25611 Stiffness of right shoulder, not elsewhere classified: Secondary | ICD-10-CM | POA: Diagnosis present

## 2015-02-24 DIAGNOSIS — C50411 Malignant neoplasm of upper-outer quadrant of right female breast: Secondary | ICD-10-CM | POA: Diagnosis not present

## 2015-02-24 DIAGNOSIS — R6889 Other general symptoms and signs: Secondary | ICD-10-CM | POA: Insufficient documentation

## 2015-02-24 DIAGNOSIS — M25511 Pain in right shoulder: Secondary | ICD-10-CM

## 2015-02-24 NOTE — Therapy (Signed)
[place], [place], [address] Phone: [phone]   Fax:  [phone]  Physical Therapy Evaluation  Patient Details  Name: Loretta Sanchez MRN: [id number] Date of Birth: 17-Jul-1976 Referring Provider:  Irene Limbo, MD  Encounter Date: 02/24/2015      PT End of Session - 02/24/15 1237    Visit Number 1   Number of Visits 9   Date for PT Re-Evaluation 03/25/15   PT Start Time 1105   PT Stop Time 1150   PT Time Calculation (min) 45 min   Activity Tolerance Patient tolerated treatment well   Behavior During Therapy [facility] for tasks assessed/performed      Past Medical History  Diagnosis Date  . Chronic vaginitis   . Vulvar vestibulitis   . Migraines   . Cancer     right breast cancer  . Heart murmur     as an infant  . Family history of adverse reaction to anesthesia     mom has n/v and difficulty waking up    Past Surgical History  Procedure Laterality Date  . Wisdom tooth extraction    . Dilation and curettage of uterus    . Re-excision of breast cancer,superior margins Right 01/25/2015    Procedure: EXCISION OF SKIN AND SOFT TISSUE RIGHT BREAST WITH TISSUE EXPANSION;  Surgeon: Rolm Bookbinder, MD;  Location: [place];  Service: General;  Laterality: Right;  . Simple mastectomy with axillary sentinel node biopsy Right 12/21/2014    Procedure: RIGHT SKIN SPARING MASTECTOMY WITH RIGHT  AXILLARY SENTINEL LYMPH NODE BIOPSY;  Surgeon: Rolm Bookbinder, MD;  Location: [place];  Service: General;  Laterality: Right;  . Breast reconstruction with placement of tissue expander and flex hd (acellular hydrated dermis) Right 12/21/2014    Procedure: RIGHT  BREAST RECONSTRUCTION WITH PLACEMENT OF TISSUE EXPANDER AND  ACELLULAR DERMIS;  Surgeon: Irene Limbo, MD;  Location: [place];  Service: Plastics;  Laterality: Right;    There were no vitals filed for this visit.  Visit Diagnosis:  Stiffness of  joint, shoulder region, right - Plan: PT plan of care cert/re-cert  Pain in joint involving shoulder region, right - Plan: PT plan of care cert/re-cert  Impaired function of upper extremity - Plan: PT plan of care cert/re-cert      Subjective Assessment - 02/24/15 1112    Subjective right arm limited ROM and discomfort   Pertinent History Right breast cancer diagnosed 11/10/14; mastectomy 12/21/14; second surgery to remove more skin 01/25/15.  Expander was placed at first surgery, and has been filled three times.  Had sim for radiation last week so will start that tomorrow through the end of July   Will not have to have chemo.   Six lymph nodes removed, all negative.   No other significan health issues.                                                                               Patient Stated Goals get strength back in the arm and be able to move it, range of motion   Currently in Pain? Yes   Pain Score 3    Pain Location Axilla  Pain Orientation Right   Aggravating Factors  lying on the right side; arm up and out   Pain Relieving Factors not lying on that side            [facility] PT Assessment - 02/24/15 0001    Assessment   Medical Diagnosis right breast cancer   Onset Date/Surgical Date 12/21/14  01/25/15 for second surgery   Precautions   Precautions Other (comment)  cancer precautions   Restrictions   Weight Bearing Restrictions No   Balance Screen   Has the patient fallen in the past 6 months No   Has the patient had a decrease in activity level because of a fear of falling?  No   Is the patient reluctant to leave their home because of a fear of falling?  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    Leisure walks 2-3x/week for 3.5 miles   Cognition   Overall Cognitive Status Within Functional Limits for tasks assessed   Observation/Other Assessments   Quick DASH  9   Sensation   Additional Comments reports tingling intermittently in right arm   Posture/Postural Control   Posture/Postural Control Postural limitations   Postural Limitations Rounded Shoulders;Forward head   ROM / Strength   AROM / PROM / Strength AROM   AROM   AROM Assessment Site Shoulder   Right/Left Shoulder Right;Left   Right Shoulder Extension 74 Degrees   Right Shoulder Flexion 131 Degrees   Right Shoulder ABduction 147 Degrees   Right Shoulder Internal Rotation 85 Degrees   Right Shoulder External Rotation 80 Degrees   Left Shoulder Extension 77 Degrees   Left Shoulder Flexion 176 Degrees   Left Shoulder ABduction 180 Degrees   Left Shoulder Internal Rotation 100 Degrees   Left Shoulder External Rotation 90 Degrees           LYMPHEDEMA/ONCOLOGY QUESTIONNAIRE - 02/24/15 1129    Type   Cancer Type right breast cancer   Surgeries   Mastectomy Date 12/21/14  and 01/25/15   Saline Implant Reconstruction Date 12/21/14   Number Lymph Nodes Removed 6   Treatment   Active Radiation Treatment Yes   Date 02/25/15   Lymphedema Assessments   Lymphedema Assessments Upper extremities   Right Upper Extremity Lymphedema   10 cm Proximal to Olecranon Process 27.6 cm   Olecranon Process 25.3 cm   10 cm Proximal to Ulnar Styloid Process 21.6 cm   Just Proximal to Ulnar Styloid Process 16.4 cm   Across Hand at [employer] 19.6 cm   At [place] of 2nd Digit 6.3 cm   Left Upper Extremity Lymphedema   10 cm Proximal to Olecranon Process 26.7 cm   Olecranon Process 25 cm   10 cm Proximal to Ulnar Styloid Process 20.8 cm   Just Proximal to Ulnar Styloid Process 16.1 cm   Across Hand at [employer] 18.2 cm   At [place] of 2nd Digit 6 cm           Quick Dash - 02/24/15 0001    Open a tight or new jar  No difficulty   Do heavy household chores (wash walls, wash floors) Mild difficulty  Carry a shopping bag or briefcase No difficulty   Wash your back No difficulty   Use a knife to cut food No difficulty   During the past week, to what extent has your arm, shoulder or hand problem interfered with your normal social activities with family, friends, neighbors, or groups? Slightly   During the past week, to what extent has your arm, shoulder or hand problem limited your work or other regular daily activities Not at all   Arm, shoulder, or hand pain. Mild   Tingling (pins and needles) in your arm, shoulder, or hand Mild   Difficulty Sleeping Mild difficulty   DASH Score 9.09 %                             Long Term Clinic Goals - 02/24/15 1245    CC Long Term Goal  #1   Title right shoulder active flexion to at least 170 degrees for improved overhead reach   Baseline left at 176 on eval, right 131   Time 4   Period Weeks   Status New   CC Long Term Goal  #2   Title right shoulder active abduction to at least 170 degrees   Baseline left at 180 degrees on eval, right 147 for improved ADLs   Time 4   Period Weeks   Status New   CC Long Term Goal  #3   Title right shoulder active ER 90 degrees   Baseline left 90 degrees, right 80 on eval   Time 4   Period Weeks   Status New   CC Long Term Goal  #4   Title independent with HEP for right shoulder ROM and strengthening   Time 4   Period Weeks   Status New   CC Long Term Goal  #5   Title knowledgeable about lymphedema risk reduction practices   Time 4   Period Weeks   Status New            Plan - 02/24/15 1251    Clinical Impression Statement Also would benefit from instruction in manual lymph drainage for fullness at right flank, inferior to axilla.   PT Next Visit Plan Begin P/AA/AROM to right shoulder; advance HEP for right shoulder ROM.  Also manual lymph drainage and instruction in same for right  flank fullness (inferior to axilla).         Problem List Patient Active Problem List   Diagnosis Date Noted  . DCIS (ductal carcinoma in situ) of breast 12/21/2014  . Family history of malignant neoplasm of gastrointestinal tract 11/26/2014  . 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 Migraines 11/23/2014    SALISBURY,DONNA 02/24/2015, 5:11 PM  [place], [place], [address] Phone: [phone]   Fax:  [place], PT 02/24/2015 5:12 PM

## 2015-02-24 NOTE — Therapy (Signed)
[place], [place], [address] Phone: [phone]   Fax:  [phone]  Physical Therapy Evaluation  Patient Details  Name: Loretta Sanchez MRN: [id number] Date of Birth: Oct 05, 1975 Referring Provider:  Crist Infante, MD  Encounter Date: 02/24/2015      PT End of Session - 02/24/15 1237    Visit Number 1   Number of Visits 9   Date for PT Re-Evaluation 03/25/15   PT Start Time 1105   PT Stop Time 1150   PT Time Calculation (min) 45 min   Activity Tolerance Patient tolerated treatment well   Behavior During Therapy [facility] for tasks assessed/performed      Past Medical History  Diagnosis Date  . Chronic vaginitis   . Vulvar vestibulitis   . Migraines   . Cancer     right breast cancer  . Heart murmur     as an infant  . Family history of adverse reaction to anesthesia     mom has n/v and difficulty waking up    Past Surgical History  Procedure Laterality Date  . Wisdom tooth extraction    . Dilation and curettage of uterus    . Re-excision of breast cancer,superior margins Right 01/25/2015    Procedure: EXCISION OF SKIN AND SOFT TISSUE RIGHT BREAST WITH TISSUE EXPANSION;  Surgeon: Rolm Bookbinder, MD;  Location: [place];  Service: General;  Laterality: Right;  . Simple mastectomy with axillary sentinel node biopsy Right 12/21/2014    Procedure: RIGHT SKIN SPARING MASTECTOMY WITH RIGHT  AXILLARY SENTINEL LYMPH NODE BIOPSY;  Surgeon: Rolm Bookbinder, MD;  Location: [place];  Service: General;  Laterality: Right;  . Breast reconstruction with placement of tissue expander and flex hd (acellular hydrated dermis) Right 12/21/2014    Procedure: RIGHT  BREAST RECONSTRUCTION WITH PLACEMENT OF TISSUE EXPANDER AND  ACELLULAR DERMIS;  Surgeon: Irene Limbo, MD;  Location: [place];  Service: Plastics;  Laterality: Right;    There were no vitals filed for this visit.  Visit Diagnosis:  Stiffness of joint,  shoulder region, right - Plan: PT plan of care cert/re-cert  Pain in joint involving shoulder region, right - Plan: PT plan of care cert/re-cert  Impaired function of upper extremity - Plan: PT plan of care cert/re-cert      Subjective Assessment - 02/24/15 1112    Subjective right arm limited ROM and discomfort   Pertinent History Right breast cancer diagnosed 11/10/14; mastectomy 12/21/14; second surgery to remove more skin 01/25/15.  Expander was placed at first surgery, and has been filled three times.  Had sim for radiation last week so will start that tomorrow through the end of July   Will not have to have chemo.   Six lymph nodes removed, all negative.   No other significan health issues.                                                                               Patient Stated Goals get strength back in the arm and be able to move it, range of motion   Currently in Pain? Yes   Pain Score 3    Pain Location Axilla  Pain Orientation Right   Aggravating Factors  lying on the right side; arm up and out   Pain Relieving Factors not lying on that side            [facility] PT Assessment - 02/24/15 0001    Assessment   Medical Diagnosis right breast cancer   Onset Date/Surgical Date 12/21/14  01/25/15 for second surgery   Precautions   Precautions Other (comment)  cancer precautions   Restrictions   Weight Bearing Restrictions No   Balance Screen   Has the patient fallen in the past 6 months No   Has the patient had a decrease in activity level because of a fear of falling?  No   Is the patient reluctant to leave their home because of a fear of falling?  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    Leisure walks 2-3x/week for 3.5 miles   Cognition   Overall Cognitive Status Within Functional Limits for tasks assessed   Sensation   Additional Comments reports tingling intermittently in right arm   Posture/Postural Control   Posture/Postural Control Postural limitations   Postural Limitations Rounded Shoulders;Forward head   ROM / Strength   AROM / PROM / Strength AROM   AROM   AROM Assessment Site Shoulder   Right/Left Shoulder Right;Left   Right Shoulder Extension 74 Degrees   Right Shoulder Flexion 131 Degrees   Right Shoulder ABduction 147 Degrees   Right Shoulder Internal Rotation 85 Degrees   Right Shoulder External Rotation 80 Degrees   Left Shoulder Extension 77 Degrees   Left Shoulder Flexion 176 Degrees   Left Shoulder ABduction 180 Degrees   Left Shoulder Internal Rotation 100 Degrees   Left Shoulder External Rotation 90 Degrees           LYMPHEDEMA/ONCOLOGY QUESTIONNAIRE - 02/24/15 1129    Type   Cancer Type right breast cancer   Surgeries   Mastectomy Date 12/21/14  and 01/25/15   Saline Implant Reconstruction Date 12/21/14   Number Lymph Nodes Removed 6   Treatment   Active Radiation Treatment Yes   Date 02/25/15   Lymphedema Assessments   Lymphedema Assessments Upper extremities   Right Upper Extremity Lymphedema   10 cm Proximal to Olecranon Process 27.6 cm   Olecranon Process 25.3 cm   10 cm Proximal to Ulnar Styloid Process 21.6 cm   Just Proximal to Ulnar Styloid Process 16.4 cm   Across Hand at [employer] 19.6 cm   At [place] of 2nd Digit 6.3 cm   Left Upper Extremity Lymphedema   10 cm Proximal to Olecranon Process 26.7 cm   Olecranon Process 25 cm   10 cm Proximal to Ulnar Styloid Process 20.8 cm   Just Proximal to Ulnar Styloid Process 16.1 cm   Across Hand at [employer] 18.2 cm   At [place] of 2nd Digit 6 cm                                Long Term Clinic Goals - 02/24/15 1245    CC Long Term Goal  #1    Title  right shoulder active flexion to at least 170 degrees for improved overhead reach   Baseline left at 176 on eval, right 131   Time 4   Period Weeks   Status New   CC Long Term Goal  #2   Title right shoulder active abduction to at least 170 degrees   Baseline left at 180 degrees on eval, right 147 for improved ADLs   Time 4   Period Weeks   Status New   CC Long Term Goal  #3   Title right shoulder active ER 90 degrees   Baseline left 90 degrees, right 80 on eval   Time 4   Period Weeks   Status New   CC Long Term Goal  #4   Title independent with HEP for right shoulder ROM and strengthening   Time 4   Period Weeks   Status New   CC Long Term Goal  #5   Title knowledgeable about lymphedema risk reduction practices   Time 4   Period Weeks   Status New            Plan - 02/24/15 1238    Clinical Impression Statement Patient who is s/p right mastectomy and immediate reconstruction with two surgeries, 12/21/14 and 01/25/15, and shows limited right shoulder AROM.  She is to begin radiation treatment tomorrow.  She would like to increase her ROM and function of the right UE.  She also c/o some swelling at right flank, just inferior to axilla.   And she c/o symptoms of plantar fasciitis.                                      Pt will benefit from skilled therapeutic intervention in order to improve on the following deficits Decreased range of motion;Impaired UE functional use;Increased edema   Rehab Potential Excellent   PT Frequency 2x / week   PT Duration 4 weeks  as needed   PT Treatment/Interventions Passive range of motion;Manual techniques;Therapeutic exercise;Patient/family education;ADLs/Self Care Home Management   PT Next Visit Plan Begin P/AA/AROM to right shoulder; advance HEP for right shoulder ROM.   PT Home Exercise Plan for now, continue wall walking exercise   Recommended Other Services consider attending ABC class; otherwise, provide this information during  individual therapy sessions   Consulted and Agree with Plan of Care Patient         Problem List Patient Active Problem List   Diagnosis Date Noted  . DCIS (ductal carcinoma in situ) of breast 12/21/2014  . Family history of malignant neoplasm of gastrointestinal tract 11/26/2014  . 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 Migraines 11/23/2014    SALISBURY,DONNA 02/24/2015, 12:49 PM  [place], [place], [address] Phone: [phone]   Fax:  [place], PT 02/24/2015 12:50 PM

## 2015-02-24 NOTE — Therapy (Signed)
[place], [place], [address] Phone: [phone]   Fax:  [phone]  Physical Therapy Evaluation  Patient Details  Name: Loretta Sanchez MRN: [id number] Date of Birth: [birth year] Referring Provider:  Crist Infante, MD  Encounter Date: 02/24/2015      PT End of Session - 02/24/15 1237    Visit Number 1   Number of Visits 9   Date for PT Re-Evaluation 03/25/15   PT Start Time 1105   PT Stop Time 1150   PT Time Calculation (min) 45 min   Activity Tolerance Patient tolerated treatment well   Behavior During Therapy [facility] for tasks assessed/performed      Past Medical History  Diagnosis Date  . Chronic vaginitis   . Vulvar vestibulitis   . Migraines   . Cancer     right breast cancer  . Heart murmur     as an infant  . Family history of adverse reaction to anesthesia     mom has n/v and difficulty waking up    Past Surgical History  Procedure Laterality Date  . Wisdom tooth extraction    . Dilation and curettage of uterus    . Re-excision of breast cancer,superior margins Right 01/25/2015    Procedure: EXCISION OF SKIN AND SOFT TISSUE RIGHT BREAST WITH TISSUE EXPANSION;  Surgeon: Rolm Bookbinder, MD;  Location: [place];  Service: General;  Laterality: Right;  . Simple mastectomy with axillary sentinel node biopsy Right 12/21/2014    Procedure: RIGHT SKIN SPARING MASTECTOMY WITH RIGHT  AXILLARY SENTINEL LYMPH NODE BIOPSY;  Surgeon: Rolm Bookbinder, MD;  Location: [place];  Service: General;  Laterality: Right;  . Breast reconstruction with placement of tissue expander and flex hd (acellular hydrated dermis) Right 12/21/2014    Procedure: RIGHT  BREAST RECONSTRUCTION WITH PLACEMENT OF TISSUE EXPANDER AND  ACELLULAR DERMIS;  Surgeon: Irene Limbo, MD;  Location: [place];  Service: Plastics;  Laterality: Right;    There were no vitals filed for this visit.  Visit Diagnosis:  Stiffness of joint,  shoulder region, right - Plan: PT plan of care cert/re-cert  Pain in joint involving shoulder region, right - Plan: PT plan of care cert/re-cert  Impaired function of upper extremity - Plan: PT plan of care cert/re-cert      Subjective Assessment - 02/24/15 1112    Subjective right arm limited ROM and discomfort   Pertinent History Right breast cancer diagnosed 11/10/14; mastectomy 12/21/14; second surgery to remove more skin 01/25/15.  Expander was placed at first surgery, and has been filled three times.  Had sim for radiation last week so will start that tomorrow through the end of July   Will not have to have chemo.   Six lymph nodes removed, all negative.   No other significan health issues.                                                                               Patient Stated Goals get strength back in the arm and be able to move it, range of motion   Currently in Pain? Yes   Pain Score 3    Pain Location Axilla  Pain Orientation Right   Aggravating Factors  lying on the right side; arm up and out   Pain Relieving Factors not lying on that side            [facility] PT Assessment - 02/24/15 0001    Assessment   Medical Diagnosis right breast cancer   Onset Date/Surgical Date 12/21/14  01/25/15 for second surgery   Precautions   Precautions Other (comment)  cancer precautions   Restrictions   Weight Bearing Restrictions No   Balance Screen   Has the patient fallen in the past 6 months No   Has the patient had a decrease in activity level because of a fear of falling?  No   Is the patient reluctant to leave their home because of a fear of falling?  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    Leisure walks 2-3x/week for 3.5 miles   Cognition   Overall Cognitive Status Within Functional Limits for tasks assessed   Sensation   Additional Comments reports tingling intermittently in right arm   Posture/Postural Control   Posture/Postural Control Postural limitations   Postural Limitations Rounded Shoulders;Forward head   ROM / Strength   AROM / PROM / Strength AROM   AROM   AROM Assessment Site Shoulder   Right/Left Shoulder Right;Left   Right Shoulder Extension 74 Degrees   Right Shoulder Flexion 131 Degrees   Right Shoulder ABduction 147 Degrees   Right Shoulder Internal Rotation 85 Degrees   Right Shoulder External Rotation 80 Degrees   Left Shoulder Extension 77 Degrees   Left Shoulder Flexion 176 Degrees   Left Shoulder ABduction 180 Degrees   Left Shoulder Internal Rotation 100 Degrees   Left Shoulder External Rotation 90 Degrees           LYMPHEDEMA/ONCOLOGY QUESTIONNAIRE - 02/24/15 1129    Type   Cancer Type right breast cancer   Surgeries   Mastectomy Date 12/21/14  and 01/25/15   Saline Implant Reconstruction Date 12/21/14   Number Lymph Nodes Removed 6   Treatment   Active Radiation Treatment Yes   Date 02/25/15   Lymphedema Assessments   Lymphedema Assessments Upper extremities   Right Upper Extremity Lymphedema   10 cm Proximal to Olecranon Process 27.6 cm   Olecranon Process 25.3 cm   10 cm Proximal to Ulnar Styloid Process 21.6 cm   Just Proximal to Ulnar Styloid Process 16.4 cm   Across Hand at [employer] 19.6 cm   At [place] of 2nd Digit 6.3 cm   Left Upper Extremity Lymphedema   10 cm Proximal to Olecranon Process 26.7 cm   Olecranon Process 25 cm   10 cm Proximal to Ulnar Styloid Process 20.8 cm   Just Proximal to Ulnar Styloid Process 16.1 cm   Across Hand at [employer] 18.2 cm   At [place] of 2nd Digit 6 cm                                Long Term Clinic Goals - 02/24/15 1245    CC Long Term Goal  #1    Title  right shoulder active flexion to at least 170 degrees for improved overhead reach   Baseline left at 176 on eval, right 131   Time 4   Period Weeks   Status New   CC Long Term Goal  #2   Title right shoulder active abduction to at least 170 degrees   Baseline left at 180 degrees on eval, right 147 for improved ADLs   Time 4   Period Weeks   Status New   CC Long Term Goal  #3   Title right shoulder active ER 90 degrees   Baseline left 90 degrees, right 80 on eval   Time 4   Period Weeks   Status New   CC Long Term Goal  #4   Title independent with HEP for right shoulder ROM and strengthening   Time 4   Period Weeks   Status New   CC Long Term Goal  #5   Title knowledgeable about lymphedema risk reduction practices   Time 4   Period Weeks   Status New            Plan - 02/24/15 1251    Clinical Impression Statement Also would benefit from instruction in manual lymph drainage for fullness at right flank, inferior to axilla.   PT Next Visit Plan Begin P/AA/AROM to right shoulder; advance HEP for right shoulder ROM.  Also manual lymph drainage and instruction in same for right flank fullness (inferior to axilla).         Problem List Patient Active Problem List   Diagnosis Date Noted  . DCIS (ductal carcinoma in situ) of breast 12/21/2014  . Family history of malignant neoplasm of gastrointestinal tract 11/26/2014  . 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 Migraines 11/23/2014    Audrie Kuri 02/24/2015, 12:53 PM  [place], [place], [address] Phone: [phone]   Fax:  [place], PT 02/24/2015 12:53 PM

## 2015-02-25 ENCOUNTER — Ambulatory Visit
Admission: RE | Admit: 2015-02-25 | Discharge: 2015-02-25 | Disposition: A | Discharge status: Home / Self Care | Payer: BLUE CROSS/BLUE SHIELD | Source: Ambulatory Visit | Attending: Radiation Oncology | Admitting: Radiation Oncology

## 2015-02-25 DIAGNOSIS — C50411 Malignant neoplasm of upper-outer quadrant of right female breast: Secondary | ICD-10-CM | POA: Diagnosis not present

## 2015-02-26 ENCOUNTER — Ambulatory Visit
Admission: RE | Admit: 2015-02-26 | Discharge: 2015-02-26 | Disposition: A | Discharge status: Home / Self Care | Payer: BLUE CROSS/BLUE SHIELD | Source: Ambulatory Visit | Attending: Radiation Oncology | Admitting: Radiation Oncology

## 2015-02-26 DIAGNOSIS — C50411 Malignant neoplasm of upper-outer quadrant of right female breast: Secondary | ICD-10-CM | POA: Diagnosis not present

## 2015-02-26 MED ORDER — ALRA NON-METALLIC DEODORANT (RAD-ONC)
1.0000 | Freq: Once | TOPICAL | Status: AC
Start: 2015-02-26 — End: 2015-02-26
  Administered 2015-02-26: 1 via TOPICAL

## 2015-02-26 MED ORDER — RADIAPLEXRX EX GEL
Freq: Once | CUTANEOUS | Status: AC
Start: 2015-02-26 — End: 2015-02-26
  Administered 2015-02-26: 17:00:00 via TOPICAL

## 2015-03-01 ENCOUNTER — Ambulatory Visit
Admission: RE | Admit: 2015-03-01 | Discharge: 2015-03-01 | Disposition: A | Discharge status: Home / Self Care | Payer: BLUE CROSS/BLUE SHIELD | Source: Ambulatory Visit | Attending: Radiation Oncology | Admitting: Radiation Oncology

## 2015-03-01 DIAGNOSIS — D0510 Intraductal carcinoma in situ of unspecified breast: Secondary | ICD-10-CM | POA: Insufficient documentation

## 2015-03-01 DIAGNOSIS — C50411 Malignant neoplasm of upper-outer quadrant of right female breast: Secondary | ICD-10-CM | POA: Diagnosis not present

## 2015-03-01 MED ORDER — RADIAPLEXRX EX GEL
Freq: Once | CUTANEOUS | Status: AC
  Administered 2015-03-01: 20:00:00 via TOPICAL

## 2015-03-01 MED ORDER — ALRA NON-METALLIC DEODORANT (RAD-ONC)
1.0000 "application " | Freq: Once | TOPICAL | Status: AC
  Administered 2015-03-01: 1 via TOPICAL

## 2015-03-01 NOTE — Progress Notes (Signed)
Pt here for patient teaching.  Pt given Radiation and You booklet, skin care instructions, Alra deodorant and Radiaplex gel. Reviewed areas of pertinence such as fatigue, hair loss, skin changes, breast tenderness and breast swelling . Pt able to give teach back of use unscented/gentle soap,apply Radiaplex bid, avoid applying anything to skin within 4 hours of treatment, avoid wearing an under wire bra and to use an electric razor if they must shave. Pt demonstrated understanding of information given and will contact nursing with any questions or concerns.  Teach Back.  Given This RN's card.  Informed that she will be seen by Dr. Pablo Ledger every Tuesday following her treatment, but if this changes she will be notified in advance.

## 2015-03-02 ENCOUNTER — Ambulatory Visit
Admission: RE | Admit: 2015-03-02 | Discharge: 2015-03-02 | Disposition: A | Discharge status: Home / Self Care | Payer: BLUE CROSS/BLUE SHIELD | Source: Ambulatory Visit | Attending: Radiation Oncology | Admitting: Radiation Oncology

## 2015-03-02 ENCOUNTER — Ambulatory Visit: Payer: BLUE CROSS/BLUE SHIELD | Admitting: Physical Therapy

## 2015-03-02 VITALS — BP 122/72 | HR 79 | Temp 98.5°F | Wt 166.2 lb

## 2015-03-02 DIAGNOSIS — C50411 Malignant neoplasm of upper-outer quadrant of right female breast: Secondary | ICD-10-CM | POA: Diagnosis not present

## 2015-03-02 DIAGNOSIS — R6889 Other general symptoms and signs: Secondary | ICD-10-CM

## 2015-03-02 DIAGNOSIS — M25511 Pain in right shoulder: Secondary | ICD-10-CM

## 2015-03-02 DIAGNOSIS — M25611 Stiffness of right shoulder, not elsewhere classified: Secondary | ICD-10-CM

## 2015-03-02 NOTE — Patient Instructions (Signed)
Cane Overhead - Supine  Hold cane at thighs with both hands, extend arms straight over head. Hold _2__ seconds. Repeat __10_ times. Do _2__ times per day.  Copyright  VHI. All rights reserved.

## 2015-03-02 NOTE — Progress Notes (Signed)
Weekly Management Note Current Dose: 7.2  Gy  Projected Dose: 50.4 Gy   Narrative:  The patient presents for routine under treatment assessment.  CBCT/MVCT images/Port film x-rays were reviewed.  The chart was checked. Doing well. No complaints.   Physical Findings: Weight: 166 lb 3.2 oz (75.388 kg). Unchanged  Impression:  The patient is tolerating radiation.  Plan:  Continue treatment as planned. Start radiaplex.

## 2015-03-02 NOTE — Therapy (Signed)
[place], [place], [address] Phone: [phone]   Fax:  [phone]  Physical Therapy Treatment  Patient Details  Name: Loretta Sanchez MRN: [id number] Date of Birth: 1976-07-02 Referring Provider:  Irene Limbo, MD  Encounter Date: 03/02/2015      PT End of Session - 03/02/15 1734    Visit Number 2   Number of Visits 9   Date for PT Re-Evaluation 03/25/15   PT Start Time 1440   PT Stop Time 1520   PT Time Calculation (min) 40 min      Past Medical History  Diagnosis Date  . Chronic vaginitis   . Vulvar vestibulitis   . Migraines   . Cancer     right breast cancer  . Heart murmur     as an infant  . Family history of adverse reaction to anesthesia     mom has n/v and difficulty waking up    Past Surgical History  Procedure Laterality Date  . Wisdom tooth extraction    . Dilation and curettage of uterus    . Re-excision of breast cancer,superior margins Right 01/25/2015    Procedure: EXCISION OF SKIN AND SOFT TISSUE RIGHT BREAST WITH TISSUE EXPANSION;  Surgeon: Rolm Bookbinder, MD;  Location: [facility] [facility];  Service: General;  Laterality: Right;  . Simple mastectomy with axillary sentinel node biopsy Right 12/21/2014    Procedure: RIGHT SKIN SPARING MASTECTOMY WITH RIGHT  AXILLARY SENTINEL LYMPH NODE BIOPSY;  Surgeon: Rolm Bookbinder, MD;  Location: [facility];  Service: General;  Laterality: Right;  . Breast reconstruction with placement of tissue expander and flex hd (acellular hydrated dermis) Right 12/21/2014    Procedure: RIGHT  BREAST RECONSTRUCTION WITH PLACEMENT OF TISSUE EXPANDER AND  ACELLULAR DERMIS;  Surgeon: Irene Limbo, MD;  Location: [place] [facility];  Service: Plastics;  Laterality: Right;    There were no vitals filed for this visit.  Visit Diagnosis:  Stiffness of joint, shoulder region, right  Pain in joint involving shoulder region, right  Impaired function of upper  extremity      Subjective Assessment - 03/02/15 1446    Subjective Had 3rd radiation treatment today feeling OK    Pertinent History Right breast cancer diagnosed 11/10/14; mastectomy 12/21/14; second surgery to remove more skin 01/25/15.  Expander was placed at first surgery, and has been filled three times.  Had sim for radiation last week so will start that tomorrow through the end of July   Will not have to have chemo.   Six lymph nodes removed, all negative.   No other significan health issues.                                                                               Patient Stated Goals get strength back in the arm and be able to move it, range of motion   Currently in Pain? No/denies            [facility] PT Assessment - 03/02/15 0001    AROM   Right Shoulder Flexion 154 Degrees  measured in supine  [facility] Adult PT Treatment/Exercise - 03/02/15 0001    Shoulder Exercises: Supine   Other Supine Exercises cane exercise for flexion    Other Supine Exercises Meek's spinal alighment exerice   Shoulder Exercises: Standing   Other Standing Exercises wall washinf, anterior chest stretch and modified downward dog   Other Standing Exercises hands on top of dooward   Shoulder Exercises: Pulleys   Flexion 2 minutes   ABduction 2 minutes   Manual Therapy   Manual Lymphatic Drainage (MLD) in sidelying to right lateral chest and back toward inguinal nodes                 PT Education - 03/02/15 1733    Education provided Yes   Education Details supine cane exercise   Person(s) Educated Patient   Methods Explanation;Demonstration;Handout   Comprehension Verbalized understanding;Returned demonstration                [facility] Goals - 03/02/15 1736    CC Long Term Goal  #1   Title right shoulder active flexion to at least 170 degrees for improved overhead reach   Status On-going   CC Long Term Goal  #2   Title right shoulder  active abduction to at least 170 degrees   Status On-going   CC Long Term Goal  #3   Title right shoulder active ER 90 degrees   Status On-going   CC Long Term Goal  #4   Title independent with HEP for right shoulder ROM and strengthening   Status On-going   CC Long Term Goal  #5   Title knowledgeable about lymphedema risk reduction practices   Status On-going            Plan - 03/02/15 1734    Clinical Impression Statement pt appears to be improving and did well with stretches today.  some softening of right flank with manual lymph drainage   PT Next Visit Plan measure shoulder to address goals , continue with wall stretches, pulleys , assess effectiveness of manual lymph draiange, ??? Ktape??        Problem List Patient Active Problem List   Diagnosis Date Noted  . DCIS (ductal carcinoma in situ) of breast 12/21/2014  . Family history of malignant neoplasm of gastrointestinal tract 11/26/2014  . 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 Migraines 11/23/2014   Donato Heinz. Owens Shark PT   Norwood Levo 03/02/2015, 5:37 PM  [employer] [facility] [place], [place], [address] Phone: [phone]   Fax:  [phone]

## 2015-03-03 ENCOUNTER — Ambulatory Visit
Admission: RE | Admit: 2015-03-03 | Discharge: 2015-03-03 | Disposition: A | Discharge status: Home / Self Care | Payer: BLUE CROSS/BLUE SHIELD | Source: Ambulatory Visit | Attending: Radiation Oncology | Admitting: Radiation Oncology

## 2015-03-03 DIAGNOSIS — C50411 Malignant neoplasm of upper-outer quadrant of right female breast: Secondary | ICD-10-CM | POA: Diagnosis not present

## 2015-03-04 ENCOUNTER — Ambulatory Visit
Admission: RE | Admit: 2015-03-04 | Discharge: 2015-03-04 | Disposition: A | Discharge status: Home / Self Care | Payer: BLUE CROSS/BLUE SHIELD | Source: Ambulatory Visit | Attending: Radiation Oncology | Admitting: Radiation Oncology

## 2015-03-04 ENCOUNTER — Ambulatory Visit: Payer: BLUE CROSS/BLUE SHIELD | Admitting: Physical Therapy

## 2015-03-04 DIAGNOSIS — M25611 Stiffness of right shoulder, not elsewhere classified: Secondary | ICD-10-CM | POA: Diagnosis not present

## 2015-03-04 DIAGNOSIS — C50411 Malignant neoplasm of upper-outer quadrant of right female breast: Secondary | ICD-10-CM | POA: Diagnosis not present

## 2015-03-04 DIAGNOSIS — M25511 Pain in right shoulder: Secondary | ICD-10-CM

## 2015-03-04 DIAGNOSIS — R6889 Other general symptoms and signs: Secondary | ICD-10-CM

## 2015-03-04 NOTE — Patient Instructions (Signed)
[email or web address] Courses Online Strength after breast cancer Right of the page: lymphedema education session

## 2015-03-04 NOTE — Therapy (Signed)
[place], [place], [address] Phone: [phone]   Fax:  [phone]  Physical Therapy Treatment  Patient Details  Name: Loretta Sanchez MRN: [id number] Date of Birth: Oct 09, 1975 Referring Provider:  Irene Limbo, MD  Encounter Date: 03/04/2015      PT End of Session - 03/04/15 1728    Visit Number 3   Number of Visits 9   Date for PT Re-Evaluation 03/25/15   PT Start Time 3235   PT Stop Time 1515   PT Time Calculation (min) 38 min      Past Medical History  Diagnosis Date  . Chronic vaginitis   . Vulvar vestibulitis   . Migraines   . Cancer     right breast cancer  . Heart murmur     as an infant  . Family history of adverse reaction to anesthesia     mom has n/v and difficulty waking up    Past Surgical History  Procedure Laterality Date  . Wisdom tooth extraction    . Dilation and curettage of uterus    . Re-excision of breast cancer,superior margins Right 01/25/2015    Procedure: EXCISION OF SKIN AND SOFT TISSUE RIGHT BREAST WITH TISSUE EXPANSION;  Surgeon: Rolm Bookbinder, MD;  Location: [place] [facility];  Service: General;  Laterality: Right;  . Simple mastectomy with axillary sentinel node biopsy Right 12/21/2014    Procedure: RIGHT SKIN SPARING MASTECTOMY WITH RIGHT  AXILLARY SENTINEL LYMPH NODE BIOPSY;  Surgeon: Rolm Bookbinder, MD;  Location: [facility];  Service: General;  Laterality: Right;  . Breast reconstruction with placement of tissue expander and flex hd (acellular hydrated dermis) Right 12/21/2014    Procedure: RIGHT  BREAST RECONSTRUCTION WITH PLACEMENT OF TISSUE EXPANDER AND  ACELLULAR DERMIS;  Surgeon: Irene Limbo, MD;  Location: [facility];  Service: Plastics;  Laterality: Right;    There were no vitals filed for this visit.  Visit Diagnosis:  Stiffness of joint, shoulder region, right  Pain in joint involving shoulder region, right  Impaired function of upper  extremity      Subjective Assessment - 03/04/15 1442    Subjective had some soreness in right back when she went to lie down last night    Pertinent History Right breast cancer diagnosed 11/10/14; mastectomy 12/21/14; second surgery to remove more skin 01/25/15.  Expander was placed at first surgery, and has been filled three times.  Had sim for radiation last week so will start that tomorrow through the end of July   Will not have to have chemo.   Six lymph nodes removed, all negative.   No other significan health issues.                                                                               Patient Stated Goals get strength back in the arm and be able to move it, range of motion   Currently in Pain? Yes   Pain Score 2    Pain Location Chest   Pain Orientation Right;Lateral   Pain Descriptors / Indicators Aching;Tingling   Pain Type Acute pain   Aggravating Factors  Lying on right side.  [facility] PT Assessment - 03/04/15 0001    AROM   Right Shoulder Flexion 153 Degrees  standing   Right Shoulder ABduction 136 Degrees                     [facility] Adult PT Treatment/Exercise - 03/04/15 0001    Shoulder Exercises: Supine   Other Supine Exercises lower trunk rotation, bridged, clam    Other Supine Exercises chest press 2 sets of 10 with no weights   Shoulder Exercises: Standing   Flexion --  finger ladder to 29 x 3   ABduction --  finger ladder 29 x3    Other Standing Exercises Strength ABC stretched, modified downward dog    Shoulder Exercises: Pulleys   Flexion 2 minutes   ABduction 2 minutes   Manual Therapy   Manual therapy comments provided square of 1/4 inch foam to wear under bra at lateral chest                 PT Education - 03/04/15 1728    Education provided Yes   Education Details began instruction in strength ABC program provided [employer] web link for lymphedema education session   Person(s) Educated Patient   Methods  Demonstration;Explanation;Handout   Comprehension Verbalized understanding;Need further instruction                [facility] Goals - 03/04/15 1735    CC Long Term Goal  #1   Title right shoulder active flexion to at least 170 degrees for improved overhead reach   Baseline 153  on 03/04/2015   Status On-going   CC Long Term Goal  #2   Title right shoulder active abduction to at least 170 degrees   Baseline 136 on 6/23    CC Long Term Goal  #3   Title right shoulder active ER 90 degrees   Status On-going   CC Long Term Goal  #4   Title independent with HEP for right shoulder ROM and strengthening   Status On-going   CC Long Term Goal  #5   Title knowledgeable about lymphedema risk reduction practices   Status On-going            Plan - 03/04/15 1732    Clinical Impression Statement continues to do well with stretches, but and pain at lateral chest and anterior shoulder. Interested in Strength ABC program and received initial instruction well. Provided compression patch to help with lateral trunk edema  applied patch of Ktape to left wrist to assess for skin reaction   PT Next Visit Plan measure Ext rotation rt shoulder to address goals assess if she watched klose lymphedema eduction session to assess for lymphedema risk reduction. do table stretches, PROM complete strength ABC insturction if time with no weights        Problem List Patient Active Problem List   Diagnosis Date Noted  . DCIS (ductal carcinoma in situ) of breast 12/21/2014  . Family history of malignant neoplasm of gastrointestinal tract 11/26/2014  . 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 Migraines 11/23/2014   Donato Heinz. Owens Shark, PT  03/04/2015, 5:39 PM  [place], [place], [address] Phone: [phone]   Fax:  [phone]

## 2015-03-05 ENCOUNTER — Ambulatory Visit
Admission: RE | Admit: 2015-03-05 | Discharge: 2015-03-05 | Disposition: A | Discharge status: Home / Self Care | Payer: BLUE CROSS/BLUE SHIELD | Source: Ambulatory Visit | Attending: Radiation Oncology | Admitting: Radiation Oncology

## 2015-03-05 DIAGNOSIS — C50411 Malignant neoplasm of upper-outer quadrant of right female breast: Secondary | ICD-10-CM | POA: Diagnosis not present

## 2015-03-08 ENCOUNTER — Telehealth: Payer: Self-pay | Admitting: *Deleted

## 2015-03-08 ENCOUNTER — Ambulatory Visit
Admission: RE | Admit: 2015-03-08 | Discharge: 2015-03-08 | Disposition: A | Discharge status: Home / Self Care | Payer: BLUE CROSS/BLUE SHIELD | Source: Ambulatory Visit | Attending: Radiation Oncology | Admitting: Radiation Oncology

## 2015-03-08 ENCOUNTER — Ambulatory Visit: Payer: BLUE CROSS/BLUE SHIELD | Admitting: Physical Therapy

## 2015-03-08 DIAGNOSIS — C50411 Malignant neoplasm of upper-outer quadrant of right female breast: Secondary | ICD-10-CM | POA: Diagnosis not present

## 2015-03-08 NOTE — Telephone Encounter (Signed)
Spoke to pt concerning xrt treatment and assessed needs.  Relate doing well and in question of Mirana d/t 2 heavy menstral cycles a month. Per Dr. Jana Hakim note-gave clearance for [facility] and informed pt of this. Relate she will get in touch with ob/gyn. Denies needs or concerns at this time. Encourage pt to call with questions. Received verbal understanding.

## 2015-03-09 ENCOUNTER — Ambulatory Visit
Admission: RE | Admit: 2015-03-09 | Discharge: 2015-03-09 | Disposition: A | Discharge status: Home / Self Care | Payer: BLUE CROSS/BLUE SHIELD | Source: Ambulatory Visit | Attending: Radiation Oncology | Admitting: Radiation Oncology

## 2015-03-09 ENCOUNTER — Other Ambulatory Visit: Payer: Self-pay | Admitting: Oncology

## 2015-03-09 ENCOUNTER — Ambulatory Visit: Payer: BLUE CROSS/BLUE SHIELD | Admitting: Physical Therapy

## 2015-03-09 VITALS — BP 113/72 | HR 86 | Temp 98.5°F | Wt 165.7 lb

## 2015-03-09 DIAGNOSIS — C50411 Malignant neoplasm of upper-outer quadrant of right female breast: Secondary | ICD-10-CM | POA: Diagnosis not present

## 2015-03-09 NOTE — Progress Notes (Signed)
  Radiation Oncology         [phone]   Name: Loretta Sanchez MRN: [id number]   Date: 03/09/2015  DOB: 11-Jun-1976   Weekly Radiation Therapy Management    ICD-9-CM ICD-10-CM   1. 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 Mild redness in mid-chest. No problems from radiation. Scheduled for assessment today at 3:45 at [facility] as she is having a very heavy period changing pads and tamposn every 1.5 to 2 hours. Stopped birth control in March 2016.  The patient is without complaint. Set-up films were reviewed. The chart was checked.  Physical Findings  weight is 165 lb 11.2 oz (75.161 kg). Her temperature is 98.5 F (36.9 C). Her blood pressure is 113/72 and her pulse is 86. . Weight essentially stable.  No significant changes.  Impression The patient is tolerating radiation.  Plan Continue treatment as planned.  Reminded patient that since she is off of her BCP, to avoid pregnancy with radiotherapy.     This document serves as a record of services personally performed by Tyler Pita, MD. It was created on his behalf by Arlyce Harman, a trained medical scribe. The creation of this record is based on the scribe's personal observations and the provider's statements to them. This document has been checked and approved by the attending provider.       [name] Tammi Klippel, M.D.

## 2015-03-09 NOTE — Progress Notes (Signed)
Weekly assessment of radiation to right breast.treatment 9 of 28.Mild redness in mid-chest.No problems from radiation.Scheduled for assessment today at 3:45 at [facility] as she is having a very heavy period changing pads and tamposn every 1.5 to 2 hours.Stopped birth control in March 2016.

## 2015-03-10 ENCOUNTER — Ambulatory Visit
Admission: RE | Admit: 2015-03-10 | Discharge: 2015-03-10 | Disposition: A | Discharge status: Home / Self Care | Payer: BLUE CROSS/BLUE SHIELD | Source: Ambulatory Visit | Attending: Radiation Oncology | Admitting: Radiation Oncology

## 2015-03-10 ENCOUNTER — Ambulatory Visit: Payer: BLUE CROSS/BLUE SHIELD | Admitting: Physical Therapy

## 2015-03-10 DIAGNOSIS — R6889 Other general symptoms and signs: Secondary | ICD-10-CM

## 2015-03-10 DIAGNOSIS — M25511 Pain in right shoulder: Secondary | ICD-10-CM

## 2015-03-10 DIAGNOSIS — C50411 Malignant neoplasm of upper-outer quadrant of right female breast: Secondary | ICD-10-CM | POA: Diagnosis not present

## 2015-03-10 DIAGNOSIS — M25611 Stiffness of right shoulder, not elsewhere classified: Secondary | ICD-10-CM

## 2015-03-10 DIAGNOSIS — Z9189 Other specified personal risk factors, not elsewhere classified: Secondary | ICD-10-CM

## 2015-03-10 NOTE — Therapy (Signed)
[place], [place], [address] Phone: [phone]   Fax:  [phone]  Physical Therapy Treatment  Patient Details  Name: Loretta Sanchez MRN: [id number] Date of Birth: Dec 07, 1975 Referring Provider:  Irene Limbo, MD  Encounter Date: 03/10/2015      PT End of Session - 03/10/15 1636    Visit Number 4   Number of Visits 9   Date for PT Re-Evaluation 03/25/15   PT Start Time 5320   PT Stop Time 1610   PT Time Calculation (min) 39 min   Activity Tolerance Patient tolerated treatment well   Behavior During Therapy [facility] for tasks assessed/performed      Past Medical History  Diagnosis Date  . Chronic vaginitis   . Vulvar vestibulitis   . Migraines   . Cancer     right breast cancer  . Heart murmur     as an infant  . Family history of adverse reaction to anesthesia     mom has n/v and difficulty waking up    Past Surgical History  Procedure Laterality Date  . Wisdom tooth extraction    . Dilation and curettage of uterus    . Re-excision of breast cancer,superior margins Right 01/25/2015    Procedure: EXCISION OF SKIN AND SOFT TISSUE RIGHT BREAST WITH TISSUE EXPANSION;  Surgeon: Rolm Bookbinder, MD;  Location: [place];  Service: General;  Laterality: Right;  . Simple mastectomy with axillary sentinel node biopsy Right 12/21/2014    Procedure: RIGHT SKIN SPARING MASTECTOMY WITH RIGHT  AXILLARY SENTINEL LYMPH NODE BIOPSY;  Surgeon: Rolm Bookbinder, MD;  Location: [place];  Service: General;  Laterality: Right;  . Breast reconstruction with placement of tissue expander and flex hd (acellular hydrated dermis) Right 12/21/2014    Procedure: RIGHT  BREAST RECONSTRUCTION WITH PLACEMENT OF TISSUE EXPANDER AND  ACELLULAR DERMIS;  Surgeon: Irene Limbo, MD;  Location: [place];  Service: Plastics;  Laterality: Right;    There were no vitals filed for this visit.  Visit Diagnosis:  Stiffness of joint,  shoulder region, right  Pain in joint involving shoulder region, right  Impaired function of upper extremity  At risk for lymphedema      Subjective Assessment - 03/10/15 1532    Subjective On day 10 of radiation.  Does stretches while waiting for radiation.   Currently in Pain? No/denies            [facility] PT Assessment - 03/10/15 0001    AROM   Right Shoulder Flexion 141 Degrees  sitting   Right Shoulder ABduction 167 Degrees   Right Shoulder External Rotation 80 Degrees                     [facility] Adult PT Treatment/Exercise - 03/10/15 0001    Exercises   Exercises Other Exercises   Other Exercises  All strength ABC program resistance exercises done today without added weight, 10 reps each:  chest press, squats, standing "W", standing side leg lift, scaption against wall, steps, tricep kickbacks, calf raises, bicep curls.   Manual Therapy   Manual Therapy Passive ROM   Passive ROM to right shoulder with stretch to patient tolerance for ER, abduction, and flexion       Also discussed with patient about Network engineer.  She has watched the lymphedema one and got basic education about the lymphatic system.  She asked if I thought the swelling she has in her  left flank is lymphedema.  I said I thought it is most likely post-surgical swelling, given that she has had two surgeries in the past couple of months and given her low risk for lymphedema.  I educated her about risk factors for lymphedema and about lymphedema risk reduction, including wearing a compression sleeve for airplane flight (which she says she does not do) and vigorous activity.  I educated her about infection risk, signs, and prevention as well.          PT Education - 03/10/15 1635    Education provided Yes   Education Details strength ABC program resistance exercises and use of the log   Person(s) Educated Patient   Methods Explanation;Demonstration;Handout   Comprehension  Verbalized understanding;Returned demonstration                [facility] Goals - 03/10/15 1644    CC Long Term Goal  #1   Status On-going   CC Long Term Goal  #2   Status On-going   CC Long Term Goal  #3   Status On-going   CC Long Term Goal  #4   Status On-going   CC Long Term Goal  #5   Status Partially Met            Plan - 03/10/15 1641    Clinical Impression Statement Patient shows increased active abduction of right shoulder; flexion has decreased slightly from last measure but is still greater than on initial eval; external rotation has not changed.  She did well with instruction in the resto of strength ABC program today, but had not brought handouts back with her.   Pt will benefit from skilled therapeutic intervention in order to improve on the following deficits Decreased range of motion;Impaired UE functional use;Increased edema   Rehab Potential Excellent   PT Frequency 2x / week   PT Duration 4 weeks   PT Treatment/Interventions Manual techniques;Passive range of motion;Patient/family education;Therapeutic exercise   PT Next Visit Plan Review strength ABC as needed and make changes to her handouts as needed; continue P/AA/AROM of right shoulder.   PT Home Exercise Plan Do strength ABC program once before next session here.   Consulted and Agree with Plan of Care Patient        Problem List Patient Active Problem List   Diagnosis Date Noted  . DCIS (ductal carcinoma in situ) of breast 12/21/2014  . Family history of malignant neoplasm of gastrointestinal tract 11/26/2014  . 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 Migraines 11/23/2014    Loretta Sanchez 03/10/2015, 4:46 PM  [place], [place], [address] Phone: [phone]   Fax:  Box, PT 03/10/2015 4:46 PM

## 2015-03-11 ENCOUNTER — Ambulatory Visit
Admission: RE | Admit: 2015-03-11 | Discharge: 2015-03-11 | Disposition: A | Discharge status: Home / Self Care | Payer: BLUE CROSS/BLUE SHIELD | Source: Ambulatory Visit | Attending: Radiation Oncology | Admitting: Radiation Oncology

## 2015-03-11 DIAGNOSIS — C50411 Malignant neoplasm of upper-outer quadrant of right female breast: Secondary | ICD-10-CM | POA: Diagnosis not present

## 2015-03-12 ENCOUNTER — Ambulatory Visit
Admission: RE | Admit: 2015-03-12 | Discharge: 2015-03-12 | Disposition: A | Discharge status: Home / Self Care | Payer: BLUE CROSS/BLUE SHIELD | Source: Ambulatory Visit | Attending: Radiation Oncology | Admitting: Radiation Oncology

## 2015-03-12 DIAGNOSIS — C50411 Malignant neoplasm of upper-outer quadrant of right female breast: Secondary | ICD-10-CM | POA: Diagnosis not present

## 2015-03-16 ENCOUNTER — Ambulatory Visit
Admission: RE | Admit: 2015-03-16 | Discharge: 2015-03-16 | Disposition: A | Discharge status: Home / Self Care | Payer: BLUE CROSS/BLUE SHIELD | Source: Ambulatory Visit | Attending: Radiation Oncology | Admitting: Radiation Oncology

## 2015-03-16 ENCOUNTER — Ambulatory Visit: Payer: BLUE CROSS/BLUE SHIELD | Attending: Plastic Surgery | Admitting: Physical Therapy

## 2015-03-16 VITALS — BP 116/75 | HR 94 | Temp 98.4°F | Wt 167.2 lb

## 2015-03-16 DIAGNOSIS — Z9189 Other specified personal risk factors, not elsewhere classified: Secondary | ICD-10-CM | POA: Insufficient documentation

## 2015-03-16 DIAGNOSIS — M25611 Stiffness of right shoulder, not elsewhere classified: Secondary | ICD-10-CM | POA: Diagnosis present

## 2015-03-16 DIAGNOSIS — M25511 Pain in right shoulder: Secondary | ICD-10-CM | POA: Insufficient documentation

## 2015-03-16 DIAGNOSIS — R6889 Other general symptoms and signs: Secondary | ICD-10-CM

## 2015-03-16 DIAGNOSIS — C50411 Malignant neoplasm of upper-outer quadrant of right female breast: Secondary | ICD-10-CM

## 2015-03-16 NOTE — Progress Notes (Signed)
Weekly Management Note Current Dose: 23.4  Gy  Projected Dose: 50.4 Gy   Narrative:  The patient presents for routine under treatment assessment.  CBCT/MVCT images/Port film x-rays were reviewed.  The chart was checked. Doing well. Right side is sore.   Physical Findings: Weight: 167 lb 3.2 oz (75.841 kg). Pink skin superiorly over implant.   Impression:  The patient is tolerating radiation.  Plan:  Continue treatment as planned.

## 2015-03-16 NOTE — Therapy (Signed)
[place], [place], [address] Phone: [phone]   Fax:  [phone]  Physical Therapy Treatment  Patient Details  Name: Loretta Sanchez MRN: [id number] Date of Birth: September 25, 1975 Referring Provider:  Irene Limbo, MD  Encounter Date: 03/16/2015      PT End of Session - 03/16/15 1719    Visit Number 5   Number of Visits 9   Date for PT Re-Evaluation 03/25/15   PT Start Time 6384   PT Stop Time 1602   PT Time Calculation (min) 46 min   Activity Tolerance Patient limited by pain;Patient tolerated treatment well   Behavior During Therapy [facility] for tasks assessed/performed      Past Medical History  Diagnosis Date  . Chronic vaginitis   . Vulvar vestibulitis   . Migraines   . Cancer     right breast cancer  . Heart murmur     as an infant  . Family history of adverse reaction to anesthesia     mom has n/v and difficulty waking up    Past Surgical History  Procedure Laterality Date  . Wisdom tooth extraction    . Dilation and curettage of uterus    . Re-excision of breast cancer,superior margins Right 01/25/2015    Procedure: EXCISION OF SKIN AND SOFT TISSUE RIGHT BREAST WITH TISSUE EXPANSION;  Surgeon: Rolm Bookbinder, MD;  Location: [place];  Service: General;  Laterality: Right;  . Simple mastectomy with axillary sentinel node biopsy Right 12/21/2014    Procedure: RIGHT SKIN SPARING MASTECTOMY WITH RIGHT  AXILLARY SENTINEL LYMPH NODE BIOPSY;  Surgeon: Rolm Bookbinder, MD;  Location: [place];  Service: General;  Laterality: Right;  . Breast reconstruction with placement of tissue expander and flex hd (acellular hydrated dermis) Right 12/21/2014    Procedure: RIGHT  BREAST RECONSTRUCTION WITH PLACEMENT OF TISSUE EXPANDER AND  ACELLULAR DERMIS;  Surgeon: Irene Limbo, MD;  Location: [place];  Service: Plastics;  Laterality: Right;    There were no vitals filed for this visit.  Visit  Diagnosis:  Stiffness of joint, shoulder region, right  Pain in joint involving shoulder region, right  Impaired function of upper extremity      Subjective Assessment - 03/16/15 1523    Subjective Nothing new.  Did do exercises yesterday.  Had an question about one exercise today.  Did 1 lb. resistance with exercises at home.   Currently in Pain? No/denies            [facility] PT Assessment - 03/16/15 0001    AROM   Right Shoulder Flexion 155 Degrees  measured after stretching today   Right Shoulder ABduction 169 Degrees                     [facility] Adult PT Treatment/Exercise - 03/16/15 0001    Shoulder Exercises: Standing   Other Standing Exercises finger ladder for abduction x 5   Shoulder Exercises: Pulleys   Flexion 2 minutes   ABduction 2 minutes   Shoulder Exercises: ROM/Strengthening   Ball on Wall 10 reps for flexion   Manual Therapy   Manual Therapy Soft tissue mobilization;Myofascial release;Passive ROM   Myofascial Release at right anterior shoulder, soft tissue stretch going lateral and superior; right UE myofascial pulling in supine and into left sidelying;    Passive ROM to right shoulder with stretch to patient tolerance for ER, abduction, and flexion  [facility] Goals - 03/10/15 1644    CC Long Term Goal  #1   Status On-going   CC Long Term Goal  #2   Status On-going   CC Long Term Goal  #3   Status On-going   CC Long Term Goal  #4   Status On-going   CC Long Term Goal  #5   Status Partially Met            Plan - 03/16/15 1720    Clinical Impression Statement Both active right shoulder flexion and abduction were a couple of degrees improved from their best measurements today.  Pt. reported feeling comfortable with what to do with strength ABC program after one question was answered, but would probably benefit from one more review of the whole thing.                                                  Pt will benefit from skilled therapeutic intervention in order to improve on the following deficits Decreased range of motion;Impaired UE functional use;Increased edema   Rehab Potential Excellent   PT Frequency 2x / week   PT Duration 4 weeks   PT Treatment/Interventions Manual techniques;Passive range of motion;Patient/family education;Therapeutic exercise   PT Next Visit Plan Review strength ABC program one time more (patient felt confident today that she could do it) and make changes to handouts as needed.  P/AA/AROM of right shoulder.   Consulted and Agree with Plan of Care Patient        Problem List Patient Active Problem List   Diagnosis Date Noted  . DCIS (ductal carcinoma in situ) of breast 12/21/2014  . Family history of malignant neoplasm of gastrointestinal tract 11/26/2014  . 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 Migraines 11/23/2014    Loretta Sanchez 03/16/2015, 5:23 PM  [place], [place], [address] Phone: [phone]   Fax:  [place], PT 03/16/2015 5:23 PM

## 2015-03-17 ENCOUNTER — Ambulatory Visit
Admission: RE | Admit: 2015-03-17 | Discharge: 2015-03-17 | Disposition: A | Discharge status: Home / Self Care | Payer: BLUE CROSS/BLUE SHIELD | Source: Ambulatory Visit | Attending: Radiation Oncology | Admitting: Radiation Oncology

## 2015-03-17 DIAGNOSIS — C50411 Malignant neoplasm of upper-outer quadrant of right female breast: Secondary | ICD-10-CM | POA: Diagnosis not present

## 2015-03-18 ENCOUNTER — Ambulatory Visit
Admission: RE | Admit: 2015-03-18 | Discharge: 2015-03-18 | Disposition: A | Discharge status: Home / Self Care | Payer: BLUE CROSS/BLUE SHIELD | Source: Ambulatory Visit | Attending: Radiation Oncology | Admitting: Radiation Oncology

## 2015-03-18 ENCOUNTER — Ambulatory Visit: Payer: BLUE CROSS/BLUE SHIELD | Admitting: Physical Therapy

## 2015-03-18 DIAGNOSIS — R6889 Other general symptoms and signs: Secondary | ICD-10-CM

## 2015-03-18 DIAGNOSIS — M25611 Stiffness of right shoulder, not elsewhere classified: Secondary | ICD-10-CM

## 2015-03-18 DIAGNOSIS — Z9189 Other specified personal risk factors, not elsewhere classified: Secondary | ICD-10-CM

## 2015-03-18 DIAGNOSIS — M25511 Pain in right shoulder: Secondary | ICD-10-CM

## 2015-03-18 DIAGNOSIS — C50411 Malignant neoplasm of upper-outer quadrant of right female breast: Secondary | ICD-10-CM | POA: Diagnosis not present

## 2015-03-18 NOTE — Patient Instructions (Signed)
Over Head Pull: Narrow Grip        On back, knees bent, feet flat, band across thighs, elbows straight but relaxed. Pull hands apart (start). Keeping elbows straight, bring arms up and over head, hands toward floor. Keep pull steady on band. Hold momentarily. Return slowly, keeping pull steady, back to start. Repeat 10___ times. Band color ___green___   Side Pull: Double Arm   On back, knees bent, feet flat. Arms perpendicular to body, shoulder level, elbows straight but relaxed. Pull arms out to sides, elbows straight. Resistance band comes across collarbones, hands toward floor. Hold momentarily. Slowly return to starting position. Repeat _10__ times. Band color _green____   Sash   On back, knees bent, feet flat, left hand on left hip, right hand above left. Pull right arm DIAGONALLY (hip to shoulder) across chest. Bring right arm along head toward floor. Hold momentarily. Slowly return to starting position. Repeat _10 __ times. Do with left arm. Band color _green_____   Shoulder Rotation: Double Arm   On back, knees bent, feet flat, elbows tucked at sides, bent 90, hands palms up. Pull hands apart and down toward floor, keeping elbows near sides. Hold momentarily. Slowly return to starting position. Repeat _10__ times. Band color __green____

## 2015-03-18 NOTE — Therapy (Signed)
[place], [place], [address] Phone: [phone]   Fax:  [phone]  Physical Therapy Treatment  Patient Details  Name: Loretta Sanchez MRN: [id number] Date of Birth: Sep 29, 1975 Referring Provider:  Irene Limbo, MD  Encounter Date: 03/18/2015      PT End of Session - 03/18/15 1219    Visit Number 6   Number of Visits 9   Date for PT Re-Evaluation 03/25/15   PT Start Time 4888   PT Stop Time 1100   PT Time Calculation (min) 45 min      Past Medical History  Diagnosis Date  . Chronic vaginitis   . Vulvar vestibulitis   . Migraines   . Cancer     right breast cancer  . Heart murmur     as an infant  . Family history of adverse reaction to anesthesia     mom has n/v and difficulty waking up    Past Surgical History  Procedure Laterality Date  . Wisdom tooth extraction    . Dilation and curettage of uterus    . Re-excision of breast cancer,superior margins Right 01/25/2015    Procedure: EXCISION OF SKIN AND SOFT TISSUE RIGHT BREAST WITH TISSUE EXPANSION;  Surgeon: Rolm Bookbinder, MD;  Location: [place];  Service: General;  Laterality: Right;  . Simple mastectomy with axillary sentinel node biopsy Right 12/21/2014    Procedure: RIGHT SKIN SPARING MASTECTOMY WITH RIGHT  AXILLARY SENTINEL LYMPH NODE BIOPSY;  Surgeon: Rolm Bookbinder, MD;  Location: [place];  Service: General;  Laterality: Right;  . Breast reconstruction with placement of tissue expander and flex hd (acellular hydrated dermis) Right 12/21/2014    Procedure: RIGHT  BREAST RECONSTRUCTION WITH PLACEMENT OF TISSUE EXPANDER AND  ACELLULAR DERMIS;  Surgeon: Irene Limbo, MD;  Location: [place];  Service: Plastics;  Laterality: Right;    There were no vitals filed for this visit.  Visit Diagnosis:  Stiffness of joint, shoulder region, right  Pain in joint involving shoulder region, right  Impaired function of upper  extremity  At risk for lymphedema      Subjective Assessment - 03/18/15 1021    Subjective I'm doing good.  !/2 way through radation. Last treatment is july 26   Pertinent History Right breast cancer diagnosed 11/10/14; mastectomy 12/21/14; second surgery to remove more skin 01/25/15.  Expander was placed at first surgery, and has been filled three times.  Had sim for radiation last week so will start that tomorrow through the end of July   Will not have to have chemo.   Six lymph nodes removed, all negative.   No other significan health issues.                                                                               Patient Stated Goals get strength back in the arm and be able to move it, range of motion   Currently in Pain? No/denies                         [facility] Adult PT Treatment/Exercise - 03/18/15 0001    Exercises   Exercises  Other Exercises   Other Exercises  reviewed stretchin portion of Strength ABC program and reinforced insturction of porgress of weight and reps with regard to lymphatic load    Shoulder Exercises: Supine   Flexion AROM  cane flexion with arms.   Other Supine Exercises supine scapular series 10 reps with green therabanc   Other Supine Exercises foam roller for alternating arm lifts and leg marches.    Shoulder Exercises: Sidelying   Other Sidelying Exercises telescoping arms resisted flexion and extension with green theraband                         [facility] Goals - 03/10/15 1644    CC Long Term Goal  #1   Status On-going   CC Long Term Goal  #2   Status On-going   CC Long Term Goal  #3   Status On-going   CC Long Term Goal  #4   Status On-going   CC Long Term Goal  #5   Status Partially Met            Plan - 03/18/15 1219    Clinical Impression Statement pt continues steady improvement,  Program upgraded today to include more stretching in other positions and strengthening with theraband with cues for  core engagement.  She has 2 1/2 more weeks of radiation. Good effort today    PT Next Visit Plan Review home exercise  P/AA/AROM of right shoulder. continue work on foam roller and with theraband         Problem List Patient Active Problem List   Diagnosis Date Noted  . DCIS (ductal carcinoma in situ) of breast 12/21/2014  . Family history of malignant neoplasm of gastrointestinal tract 11/26/2014  . 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 Migraines 11/23/2014   Donato Heinz. Owens Shark, PT   03/18/2015, 12:25 PM  [place], [place], [address] Phone: [phone]   Fax:  [phone]

## 2015-03-19 ENCOUNTER — Ambulatory Visit
Admission: RE | Admit: 2015-03-19 | Discharge: 2015-03-19 | Disposition: A | Discharge status: Home / Self Care | Payer: BLUE CROSS/BLUE SHIELD | Source: Ambulatory Visit | Attending: Radiation Oncology | Admitting: Radiation Oncology

## 2015-03-19 DIAGNOSIS — C50411 Malignant neoplasm of upper-outer quadrant of right female breast: Secondary | ICD-10-CM | POA: Diagnosis not present

## 2015-03-22 ENCOUNTER — Ambulatory Visit: Payer: BLUE CROSS/BLUE SHIELD | Admitting: Physical Therapy

## 2015-03-22 ENCOUNTER — Ambulatory Visit
Admission: RE | Admit: 2015-03-22 | Discharge: 2015-03-22 | Disposition: A | Discharge status: Home / Self Care | Payer: BLUE CROSS/BLUE SHIELD | Source: Ambulatory Visit | Attending: Radiation Oncology | Admitting: Radiation Oncology

## 2015-03-22 DIAGNOSIS — M25511 Pain in right shoulder: Secondary | ICD-10-CM

## 2015-03-22 DIAGNOSIS — M25611 Stiffness of right shoulder, not elsewhere classified: Secondary | ICD-10-CM | POA: Diagnosis not present

## 2015-03-22 DIAGNOSIS — R6889 Other general symptoms and signs: Secondary | ICD-10-CM

## 2015-03-22 DIAGNOSIS — Z9189 Other specified personal risk factors, not elsewhere classified: Secondary | ICD-10-CM

## 2015-03-22 DIAGNOSIS — C50411 Malignant neoplasm of upper-outer quadrant of right female breast: Secondary | ICD-10-CM | POA: Diagnosis not present

## 2015-03-22 NOTE — Therapy (Signed)
[place], [place], [address] Phone: [phone]   Fax:  [phone]  Physical Therapy Treatment  Patient Details  Name: Loretta Sanchez MRN: [id number] Date of Birth: [birth year] Referring Provider:  Irene Limbo, MD  Encounter Date: 03/22/2015      PT End of Session - 03/22/15 1157    Visit Number 7   Number of Visits 13   Date for PT Re-Evaluation 04/26/15   PT Start Time 1100   PT Stop Time 1145   PT Time Calculation (min) 45 min   Activity Tolerance Patient tolerated treatment well   Behavior During Therapy [facility] for tasks assessed/performed      Past Medical History  Diagnosis Date  . Chronic vaginitis   . Vulvar vestibulitis   . Migraines   . Cancer     right breast cancer  . Heart murmur     as an infant  . Family history of adverse reaction to anesthesia     mom has n/v and difficulty waking up    Past Surgical History  Procedure Laterality Date  . Wisdom tooth extraction    . Dilation and curettage of uterus    . Re-excision of breast cancer,superior margins Right 01/25/2015    Procedure: EXCISION OF SKIN AND SOFT TISSUE RIGHT BREAST WITH TISSUE EXPANSION;  Surgeon: Rolm Bookbinder, MD;  Location: [place];  Service: General;  Laterality: Right;  . Simple mastectomy with axillary sentinel node biopsy Right 12/21/2014    Procedure: RIGHT SKIN SPARING MASTECTOMY WITH RIGHT  AXILLARY SENTINEL LYMPH NODE BIOPSY;  Surgeon: Rolm Bookbinder, MD;  Location: [place];  Service: General;  Laterality: Right;  . Breast reconstruction with placement of tissue expander and flex hd (acellular hydrated dermis) Right 12/21/2014    Procedure: RIGHT  BREAST RECONSTRUCTION WITH PLACEMENT OF TISSUE EXPANDER AND  ACELLULAR DERMIS;  Surgeon: Irene Limbo, MD;  Location: [place];  Service: Plastics;  Laterality: Right;    There were no vitals filed for this visit.  Visit Diagnosis:  Stiffness of  joint, shoulder region, right  Pain in joint involving shoulder region, right  Impaired function of upper extremity  At risk for lymphedema      Subjective Assessment - 03/22/15 1104    Subjective "Itiching a little bit today"    Pertinent History Right breast cancer diagnosed 11/10/14; mastectomy 12/21/14; second surgery to remove more skin 01/25/15.  Expander was placed at first surgery, and has been filled three times.  Had sim for radiation last week so will start that tomorrow through the end of July   Will not have to have chemo.   Six lymph nodes removed, all negative.   No other significan health issues.                                                                               Patient Stated Goals get strength back in the arm and be able to move it, range of motion   Currently in Pain? No/denies            [facility] PT Assessment - 03/22/15 0001    AROM   Right Shoulder Flexion 168 Degrees  Right Shoulder ABduction 167 Degrees                     [facility] Adult PT Treatment/Exercise - 03/22/15 0001    Self-Care   Other Self-Care Comments  reinforced lymphedema risk reduction and need for slow, but steady increase for strength ABC program as she has had 6 noded removed. showed pt options for compression sleeve and where to get them. also issued ames walker and lymphedivas catalog.pt states she will get a sleeve and gauntlet. instructed in eleveation, exercise and brief manual lymph drainage techniques for her hand as she says she may have always had a little hand swelling... even before surgery.    Exercises   Other Exercises  answered all questions about strength ABC    Shoulder Exercises: Supine   Other Supine Exercises foam roller for alternating arm lifts and leg marches, prolonged stretches over foam roll.   Shoulder Exercises: Seated   Other Seated Exercises scapular series 2 sets of  10 with blue theraband: bilateral shoulder flexion with narrow grip and wide  grip, horizontal abduction, "sash" elevation and extrernal rotation.    Shoulder Exercises: ROM/Strengthening   Other ROM/Strengthening Exercises wall stretched for shoulder and lateral trunk                 PT Education - 03/22/15 1156    Education provided Yes   Education Details lymphedema risk reduction, where to get a sleeve and gauntlet   Person(s) Educated Patient   Methods Explanation;Demonstration   Comprehension Verbalized understanding                [facility] Goals - 03/22/15 1201    CC Long Term Goal  #1   Title right shoulder active flexion to at least 170 degrees for improved overhead reach   Baseline 168 on 03/22/2015   Time 4   Period Weeks   Status On-going   CC Long Term Goal  #2   Title right shoulder active abduction to at least 170 degrees   Baseline 167 on 03/22/2015   Time 4   Period Weeks   Status On-going   CC Long Term Goal  #3   Title right shoulder active ER 90 degrees   Time 4   Period Weeks   Status On-going   CC Long Term Goal  #4   Title independent with HEP for right shoulder ROM and strengthening   Time 4   Status On-going   CC Long Term Goal  #5   Title knowledgeable about lymphedema risk reduction practices   Time 4   Period Weeks   Status Achieved            Plan - 03/22/15 1158    Clinical Impression Statement Pt has done very well with improving range of motion and is beginning to increase her strength.  She is in the last few weeks of radiation, so will slow down on arm work and focues on Pilates core strength.  Would like to see patient for final shoulder range of motion and circumference measurement after radiation is commplete so will ask for extension for 4  weeks.    Pt will benefit from skilled therapeutic intervention in order to improve on the following deficits Decreased range of motion;Impaired UE functional use;Increased edema   Rehab Potential Excellent   Clinical Impairments Affecting Rehab  Potential ongoing radiation    PT Frequency 1x / week   PT Duration 4  weeks   PT Treatment/Interventions Manual techniques;Passive range of motion;Patient/family education;Therapeutic exercise   PT Next Visit Plan Core strengthening and education   Consulted and Agree with Plan of Care Patient        Problem List Patient Active Problem List   Diagnosis Date Noted  . DCIS (ductal carcinoma in situ) of breast 12/21/2014  . Family history of malignant neoplasm of gastrointestinal tract 11/26/2014  . 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 Migraines 11/23/2014     Donato Heinz. Owens Shark, PT  03/22/2015, 12:04 PM  [place], [place], [address] Phone: [phone]   Fax:  [phone]

## 2015-03-23 ENCOUNTER — Ambulatory Visit
Admission: RE | Admit: 2015-03-23 | Discharge: 2015-03-23 | Disposition: A | Discharge status: Home / Self Care | Payer: BLUE CROSS/BLUE SHIELD | Source: Ambulatory Visit | Attending: Radiation Oncology | Admitting: Radiation Oncology

## 2015-03-23 VITALS — BP 110/65 | HR 86 | Temp 98.4°F | Wt 166.2 lb

## 2015-03-23 DIAGNOSIS — C50411 Malignant neoplasm of upper-outer quadrant of right female breast: Secondary | ICD-10-CM

## 2015-03-23 NOTE — Progress Notes (Signed)
Weekly Management Note Current Dose: 32.4  Gy  Projected Dose: 50.4 Gy   Narrative:  The patient presents for routine under treatment assessment.  CBCT/MVCT images/Port film x-rays were reviewed.  The chart was checked.Doing well. Itching medially. Asked about referral to OBGYN for ablation for uterine bleeding/mirena.   Physical Findings: Weight: 166 lb 3.2 oz (75.388 kg). Dermatitis medially and in inframammary fold.   Impression:  The patient is tolerating radiation.  Plan:  Continue treatment as planned. Add hydrocortisone.  Will ask Dr. Julien Girt to see pt earlier if possible.

## 2015-03-23 NOTE — Progress Notes (Signed)
Weekly assessment of radiation to right breast.Completed 18 of 28 treatments.Starting to have some mild itching.Continue application of radiaplex.

## 2015-03-24 ENCOUNTER — Ambulatory Visit
Admission: RE | Admit: 2015-03-24 | Discharge: 2015-03-24 | Disposition: A | Discharge status: Home / Self Care | Payer: BLUE CROSS/BLUE SHIELD | Source: Ambulatory Visit | Attending: Radiation Oncology | Admitting: Radiation Oncology

## 2015-03-24 ENCOUNTER — Ambulatory Visit: Payer: BLUE CROSS/BLUE SHIELD | Admitting: Physical Therapy

## 2015-03-24 DIAGNOSIS — C50411 Malignant neoplasm of upper-outer quadrant of right female breast: Secondary | ICD-10-CM | POA: Diagnosis not present

## 2015-03-24 MED ORDER — RADIAPLEXRX EX GEL
Freq: Once | CUTANEOUS | Status: AC
Start: 2015-03-24 — End: 2015-03-24
  Administered 2015-03-24: 15:00:00 via TOPICAL

## 2015-03-25 ENCOUNTER — Ambulatory Visit
Admission: RE | Admit: 2015-03-25 | Discharge: 2015-03-25 | Disposition: A | Discharge status: Home / Self Care | Payer: BLUE CROSS/BLUE SHIELD | Source: Ambulatory Visit | Attending: Radiation Oncology | Admitting: Radiation Oncology

## 2015-03-25 DIAGNOSIS — C50411 Malignant neoplasm of upper-outer quadrant of right female breast: Secondary | ICD-10-CM | POA: Diagnosis not present

## 2015-03-26 ENCOUNTER — Telehealth: Payer: Self-pay

## 2015-03-26 ENCOUNTER — Ambulatory Visit
Admission: RE | Admit: 2015-03-26 | Discharge: 2015-03-26 | Disposition: A | Discharge status: Home / Self Care | Payer: BLUE CROSS/BLUE SHIELD | Source: Ambulatory Visit | Attending: Radiation Oncology | Admitting: Radiation Oncology

## 2015-03-26 ENCOUNTER — Other Ambulatory Visit: Payer: Self-pay | Admitting: Radiation Oncology

## 2015-03-26 DIAGNOSIS — C50411 Malignant neoplasm of upper-outer quadrant of right female breast: Secondary | ICD-10-CM

## 2015-03-26 NOTE — Telephone Encounter (Signed)
Notified patient of appointment with [facility] [phone]) Fax:[phone] to see Creta Levin NP on April 01, 2015 at 8:20 to arrive at 8:00 am.Patient wants to be considered for ablation to help with heavy bleeding.

## 2015-03-26 NOTE — Telephone Encounter (Signed)
Left message to return my call in regards to getting ob-gyn appointment sooner than 04/19/2015.

## 2015-03-29 ENCOUNTER — Ambulatory Visit
Admission: RE | Admit: 2015-03-29 | Discharge: 2015-03-29 | Disposition: A | Discharge status: Home / Self Care | Payer: BLUE CROSS/BLUE SHIELD | Source: Ambulatory Visit | Attending: Radiation Oncology | Admitting: Radiation Oncology

## 2015-03-29 ENCOUNTER — Telehealth: Payer: Self-pay

## 2015-03-29 DIAGNOSIS — C50411 Malignant neoplasm of upper-outer quadrant of right female breast: Secondary | ICD-10-CM | POA: Diagnosis not present

## 2015-03-29 NOTE — Telephone Encounter (Signed)
Confirmed with [facility] receipt of faxed copies of referral and notes from our office.

## 2015-03-30 ENCOUNTER — Other Ambulatory Visit: Payer: Self-pay | Admitting: *Deleted

## 2015-03-30 ENCOUNTER — Ambulatory Visit
Admission: RE | Admit: 2015-03-30 | Discharge: 2015-03-30 | Disposition: A | Discharge status: Home / Self Care | Payer: BLUE CROSS/BLUE SHIELD | Source: Ambulatory Visit | Attending: Radiation Oncology | Admitting: Radiation Oncology

## 2015-03-30 ENCOUNTER — Telehealth: Payer: Self-pay | Admitting: *Deleted

## 2015-03-30 VITALS — BP 119/72 | HR 87 | Temp 98.1°F | Wt 165.7 lb

## 2015-03-30 DIAGNOSIS — C50411 Malignant neoplasm of upper-outer quadrant of right female breast: Secondary | ICD-10-CM

## 2015-03-30 NOTE — Telephone Encounter (Signed)
Scheduled and confirmed post xrt appt with Dr. Jana Hakim and Nira Conn on 04/12/15. Denies needs at this time. Encourage pt to call with questions or concerns. Received verbal understanding.

## 2015-03-30 NOTE — Progress Notes (Signed)
Weekly Management Note Current Dose: 41.4  Gy  Projected Dose: 50.4 Gy   Narrative:  The patient presents for routine under treatment assessment.  CBCT/MVCT images/Port film x-rays were reviewed.  The chart was checked. Doing well. No complaints except sore laterally and some itching superiorly. No appointment with med onc. Left breas sore last week (period this week)  Physical Findings: Weight: 165 lb 11.2 oz (75.161 kg). Unchanged. Early dry desquamation superiorly. REst of chest wall is pink  Impression:  The patient is tolerating radiation.  Plan:  Continue treatment as planned. Continue radiaplex. Discussed post RT skin care. Will contact [facility]iagators for med onc followup. Refer to survivorship. Has appt Thursday for vaginal bleeding.

## 2015-03-30 NOTE — Progress Notes (Addendum)
Weekly assessment of radiation to right chest wall.Completed 23 of 28 treatments.Having abdominal cramping(menstrual cycle).Scheduled for [facility] on 04/01/15.Skin is red with itching red rash.Continue radiaplex. BP 119/72 mmHg  Pulse 87  Temp(Src) 98.1 F (36.7 C)  Wt 165 lb 11.2 oz (75.161 kg)

## 2015-03-31 ENCOUNTER — Ambulatory Visit
Admission: RE | Admit: 2015-03-31 | Discharge: 2015-03-31 | Disposition: A | Discharge status: Home / Self Care | Payer: BLUE CROSS/BLUE SHIELD | Source: Ambulatory Visit | Attending: Radiation Oncology | Admitting: Radiation Oncology

## 2015-03-31 DIAGNOSIS — C50411 Malignant neoplasm of upper-outer quadrant of right female breast: Secondary | ICD-10-CM | POA: Diagnosis present

## 2015-04-01 ENCOUNTER — Ambulatory Visit: Payer: BLUE CROSS/BLUE SHIELD | Admitting: Physical Therapy

## 2015-04-01 ENCOUNTER — Ambulatory Visit
Admission: RE | Admit: 2015-04-01 | Discharge: 2015-04-01 | Disposition: A | Discharge status: Home / Self Care | Payer: BLUE CROSS/BLUE SHIELD | Source: Ambulatory Visit | Attending: Radiation Oncology | Admitting: Radiation Oncology

## 2015-04-01 DIAGNOSIS — M25611 Stiffness of right shoulder, not elsewhere classified: Secondary | ICD-10-CM | POA: Diagnosis not present

## 2015-04-01 DIAGNOSIS — M25511 Pain in right shoulder: Secondary | ICD-10-CM

## 2015-04-01 DIAGNOSIS — Z9189 Other specified personal risk factors, not elsewhere classified: Secondary | ICD-10-CM

## 2015-04-01 DIAGNOSIS — R6889 Other general symptoms and signs: Secondary | ICD-10-CM

## 2015-04-01 DIAGNOSIS — C50411 Malignant neoplasm of upper-outer quadrant of right female breast: Secondary | ICD-10-CM | POA: Diagnosis not present

## 2015-04-01 NOTE — Therapy (Signed)
Zsssss```` [facility] [address] [place], [place], [address] Phone: [phone]   Fax:  [phone]  Physical Therapy Treatment  Patient Details  Name: JAXYN MESTAS MRN: [id number] Date of Birth: February 27, 1976 Referring Provider:  Crist Infante, MD  Encounter Date: 04/01/2015      PT End of Session - 04/01/15 1613    Visit Number 8   Number of Visits 13   Date for PT Re-Evaluation 04/26/15   PT Start Time 9030   PT Stop Time 0923   PT Time Calculation (min) 30 min   Activity Tolerance Patient tolerated treatment well;No increased pain   Behavior During Therapy [facility] for tasks assessed/performed      Past Medical History  Diagnosis Date  . Chronic vaginitis   . Vulvar vestibulitis   . Migraines   . Cancer     right breast cancer  . Heart murmur     as an infant  . Family history of adverse reaction to anesthesia     mom has n/v and difficulty waking up    Past Surgical History  Procedure Laterality Date  . Wisdom tooth extraction    . Dilation and curettage of uterus    . Re-excision of breast cancer,superior margins Right 01/25/2015    Procedure: EXCISION OF SKIN AND SOFT TISSUE RIGHT BREAST WITH TISSUE EXPANSION;  Surgeon: Rolm Bookbinder, MD;  Location: [place];  Service: General;  Laterality: Right;  . Simple mastectomy with axillary sentinel node biopsy Right 12/21/2014    Procedure: RIGHT SKIN SPARING MASTECTOMY WITH RIGHT  AXILLARY SENTINEL LYMPH NODE BIOPSY;  Surgeon: Rolm Bookbinder, MD;  Location: [place];  Service: General;  Laterality: Right;  . Breast reconstruction with placement of tissue expander and flex hd (acellular hydrated dermis) Right 12/21/2014    Procedure: RIGHT  BREAST RECONSTRUCTION WITH PLACEMENT OF TISSUE EXPANDER AND  ACELLULAR DERMIS;  Surgeon: Irene Limbo, MD;  Location: [place];  Service: Plastics;  Laterality: Right;    There were no vitals filed for this  visit.  Visit Diagnosis:  Stiffness of joint, shoulder region, right  Pain in joint involving shoulder region, right  Impaired function of upper extremity  At risk for lymphedema      Subjective Assessment - 04/01/15 1612    Subjective No pain, wants to try Pilates to gain strength/ROM. Pt coming directly from radiation tx, 10 min late.    Currently in Pain? No/denies           Pilates Reformer used for LE/core strength, postural strength, lumbopelvic disassociation and core control.  Exercises included:  Supine Arm work 1 [employer], parallel, T and circles x 10 each  Supine pull ups 1 red bilateral overhead 2 sets x 10 (over/under hand) and single arms 1 blue on Rt and 1 Red on L Seated arms 1 red facing back, 1 blue facing front  Roll down 1 Red x 10  Single arm pull x 8 each red for post shoulder strength and thoracic mobility.        PT Education - 04/01/15 1814    Education provided Yes   Education Details Pilates principles and Reformer exercises   Person(s) Educated Patient   Methods Explanation   Comprehension Verbalized understanding;Need further instruction          [facility] Goals - 03/22/15 1201    CC Long Term Goal  #1   Title right shoulder active flexion to at least 170 degrees  for improved overhead reach   Baseline 168 on 03/22/2015   Time 4   Period Weeks   Status On-going   CC Long Term Goal  #2   Title right shoulder active abduction to at least 170 degrees   Baseline 167 on 03/22/2015   Time 4   Period Weeks   Status On-going   CC Long Term Goal  #3   Title right shoulder active ER 90 degrees   Time 4   Period Weeks   Status On-going   CC Long Term Goal  #4   Title independent with HEP for right shoulder ROM and strengthening   Time 4   Status On-going   CC Long Term Goal  #5   Title knowledgeable about lymphedema risk reduction practices   Time 4   Period Weeks   Status Achieved            Plan - 04/01/15 1816     Clinical Impression Statement Patient did well with beginner Reformer exercises. She reqiured only min cueing mostly for elongation of spine and avoiding thoracic flexion with shoulder extension.  Patient reports feeling good after session, may decide to cont with Pilates based PT to improve UE ROM and strength   Consulted and Agree with Plan of Care Patient        Problem List Patient Active Problem List   Diagnosis Date Noted  . DCIS (ductal carcinoma in situ) of breast 12/21/2014  . Family history of malignant neoplasm of gastrointestinal tract 11/26/2014  . 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 Migraines 11/23/2014    PAA,JENNIFER 04/01/2015, 6:30 PM  [facility] [address] [place], [place], [address] Phone: [phone]   Fax:  [phone]   Raeford Razor, PT 04/01/2015 6:40 PM Phone: [phone] Fax: [phone]

## 2015-04-02 ENCOUNTER — Ambulatory Visit
Admission: RE | Admit: 2015-04-02 | Discharge: 2015-04-02 | Disposition: A | Discharge status: Home / Self Care | Payer: BLUE CROSS/BLUE SHIELD | Source: Ambulatory Visit | Attending: Radiation Oncology | Admitting: Radiation Oncology

## 2015-04-02 DIAGNOSIS — C50411 Malignant neoplasm of upper-outer quadrant of right female breast: Secondary | ICD-10-CM | POA: Diagnosis not present

## 2015-04-05 ENCOUNTER — Ambulatory Visit
Admission: RE | Admit: 2015-04-05 | Discharge: 2015-04-05 | Disposition: A | Discharge status: Home / Self Care | Payer: BLUE CROSS/BLUE SHIELD | Source: Ambulatory Visit | Attending: Radiation Oncology | Admitting: Radiation Oncology

## 2015-04-05 DIAGNOSIS — C50411 Malignant neoplasm of upper-outer quadrant of right female breast: Secondary | ICD-10-CM | POA: Diagnosis not present

## 2015-04-06 ENCOUNTER — Ambulatory Visit
Admission: RE | Admit: 2015-04-06 | Discharge: 2015-04-06 | Disposition: A | Discharge status: Home / Self Care | Payer: BLUE CROSS/BLUE SHIELD | Source: Ambulatory Visit | Attending: Radiation Oncology | Admitting: Radiation Oncology

## 2015-04-06 ENCOUNTER — Encounter: Payer: Self-pay | Admitting: *Deleted

## 2015-04-06 ENCOUNTER — Encounter: Payer: Self-pay | Admitting: Radiation Oncology

## 2015-04-06 VITALS — BP 116/78 | HR 84 | Temp 98.1°F | Wt 165.9 lb

## 2015-04-06 DIAGNOSIS — C50411 Malignant neoplasm of upper-outer quadrant of right female breast: Secondary | ICD-10-CM | POA: Diagnosis not present

## 2015-04-06 NOTE — Progress Notes (Signed)
Patient has completed 28 of 28 treatments to right breast and axilla.Skin is red without any peeling or breaks in skin.Denies pain.Continue application of radiaplex twice daily for 2 to 3 weeks then change to lotion with vitamin e.Follow up in one month.Knows to call if any questions or concerns arise.

## 2015-04-06 NOTE — Progress Notes (Signed)
  Radiation Oncology         [phone] ________________________________  Name: Loretta Sanchez MRN: [id number]  Date: 04/06/2015  DOB: [birth year]  Weekly Radiation Therapy Management    ICD-9-CM ICD-10-CM   1. 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 . . . . . . . The patient presents for routine under treatment assessment.                                   The patient is without complaint. Skin is red without any peeling or breaks in skin. Denies pain. Continue application of radiaplex twice daily for 2 to 3 weeks then change to lotion with vitamin e.  Happy to complete her radiation therapy today.                                 Set-up films were reviewed.                                 The chart was checked. Physical Findings. . .  weight is 165 lb 14.4 oz (75.252 kg). Her temperature is 98.1 F (36.7 C). Her blood pressure is 116/78 and her pulse is 84. . Weight essentially stable.  No significant changes. Lungs are clear. Heart has regular rhythm and rate. Right chest wall /reconstructed area shows erythema and dry desquamation, no moist desquamation.   Impression . . . . . . . The patient is tolerating radiation. Plan . . . . . . . . . . . .  We will schedule for follow up in one month. Patient knows to call if any questions or concerns arise.  This document serves as a record of services personally performed by Gery Pray, MD. It was created on his behalf by Arlyce Harman, a trained medical scribe. The creation of this record is based on the scribe's personal observations and the provider's statements to them. This document has been checked and approved by the attending provider. ________________________________   Blair Promise, PhD, MD

## 2015-04-12 ENCOUNTER — Encounter: Payer: Self-pay | Admitting: Nurse Practitioner

## 2015-04-12 ENCOUNTER — Telehealth: Payer: Self-pay | Admitting: Oncology

## 2015-04-12 ENCOUNTER — Other Ambulatory Visit (HOSPITAL_BASED_OUTPATIENT_CLINIC_OR_DEPARTMENT_OTHER): Payer: BLUE CROSS/BLUE SHIELD

## 2015-04-12 ENCOUNTER — Ambulatory Visit (HOSPITAL_BASED_OUTPATIENT_CLINIC_OR_DEPARTMENT_OTHER): Payer: BLUE CROSS/BLUE SHIELD | Admitting: Nurse Practitioner

## 2015-04-12 VITALS — BP 122/71 | HR 88 | Temp 98.7°F | Resp 18 | Ht 69.0 in | Wt 165.6 lb

## 2015-04-12 DIAGNOSIS — C50411 Malignant neoplasm of upper-outer quadrant of right female breast: Secondary | ICD-10-CM

## 2015-04-12 DIAGNOSIS — D0511 Intraductal carcinoma in situ of right breast: Secondary | ICD-10-CM

## 2015-04-12 DIAGNOSIS — Z170 Estrogen receptor positive status [ER+]: Secondary | ICD-10-CM

## 2015-04-12 LAB — CBC WITH DIFFERENTIAL/PLATELET
BASO%: 0.8 % (ref 0.0–2.0)
BASOS ABS: 0 10*3/uL (ref 0.0–0.1)
EOS ABS: 0.2 10*3/uL (ref 0.0–0.5)
EOS%: 3.8 % (ref 0.0–7.0)
HCT: 37 % (ref 34.8–46.6)
HEMOGLOBIN: 12.8 g/dL (ref 11.6–15.9)
LYMPH#: 0.9 10*3/uL (ref 0.9–3.3)
LYMPH%: 18.1 % (ref 14.0–49.7)
MCH: 32 pg (ref 25.1–34.0)
MCHC: 34.5 g/dL (ref 31.5–36.0)
MCV: 93 fL (ref 79.5–101.0)
MONO#: 0.3 10*3/uL (ref 0.1–0.9)
MONO%: 6 % (ref 0.0–14.0)
NEUT#: 3.6 10*3/uL (ref 1.5–6.5)
NEUT%: 71.3 % (ref 38.4–76.8)
Platelets: 213 10*3/uL (ref 145–400)
RBC: 3.98 10*6/uL (ref 3.70–5.45)
RDW: 12.5 % (ref 11.2–14.5)
WBC: 5.1 10*3/uL (ref 3.9–10.3)

## 2015-04-12 LAB — COMPREHENSIVE METABOLIC PANEL (CC13)
ALBUMIN: 4 g/dL (ref 3.5–5.0)
ALT: 28 U/L (ref 0–55)
ANION GAP: 7 meq/L (ref 3–11)
AST: 24 U/L (ref 5–34)
Alkaline Phosphatase: 76 U/L (ref 40–150)
BILIRUBIN TOTAL: 0.43 mg/dL (ref 0.20–1.20)
BUN: 14.8 mg/dL (ref 7.0–26.0)
CALCIUM: 9.3 mg/dL (ref 8.4–10.4)
CO2: 27 mEq/L (ref 22–29)
Chloride: 105 mEq/L (ref 98–109)
Creatinine: 1 mg/dL (ref 0.6–1.1)
EGFR: 72 mL/min/{1.73_m2} — ABNORMAL LOW (ref >90)
Glucose: 128 mg/dl (ref 70–140)
Potassium: 4.1 mEq/L (ref 3.5–5.1)
Sodium: 139 mEq/L (ref 136–145)
Total Protein: 6.8 g/dL (ref 6.4–8.3)

## 2015-04-12 MED ORDER — TAMOXIFEN CITRATE 20 MG PO TABS: 20.0000 mg | ORAL_TABLET | Freq: Every day | ORAL | Status: DC

## 2015-04-12 NOTE — Telephone Encounter (Signed)
Gave avs & calendar for September °
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
## 2015-04-15 ENCOUNTER — Ambulatory Visit: Payer: BLUE CROSS/BLUE SHIELD | Attending: Plastic Surgery | Admitting: Physical Therapy

## 2015-04-15 DIAGNOSIS — M25511 Pain in right shoulder: Secondary | ICD-10-CM | POA: Diagnosis present

## 2015-04-15 DIAGNOSIS — M25611 Stiffness of right shoulder, not elsewhere classified: Secondary | ICD-10-CM | POA: Diagnosis not present

## 2015-04-15 DIAGNOSIS — Z9189 Other specified personal risk factors, not elsewhere classified: Secondary | ICD-10-CM | POA: Insufficient documentation

## 2015-04-15 DIAGNOSIS — R6889 Other general symptoms and signs: Secondary | ICD-10-CM | POA: Diagnosis present

## 2015-04-15 NOTE — Therapy (Signed)
[place], [place], [address] Phone: [phone]   Fax:  [phone]  Physical Therapy Treatment  Patient Details  Name: Loretta Sanchez MRN: [id number] Date of Birth: [birth year] Referring Provider:  Irene Limbo, MD  Encounter Date: 04/15/2015      PT End of Session - 04/15/15 1226    Visit Number 9   Number of Visits 13   Date for PT Re-Evaluation 04/26/15   PT Start Time 3016   PT Stop Time 1100   PT Time Calculation (min) 45 min      Past Medical History  Diagnosis Date  . Chronic vaginitis   . Vulvar vestibulitis   . Migraines   . Cancer     right breast cancer  . Heart murmur     as an infant  . Family history of adverse reaction to anesthesia     mom has n/v and difficulty waking up    Past Surgical History  Procedure Laterality Date  . Wisdom tooth extraction    . Dilation and curettage of uterus    . Re-excision of breast cancer,superior margins Right 01/25/2015    Procedure: EXCISION OF SKIN AND SOFT TISSUE RIGHT BREAST WITH TISSUE EXPANSION;  Surgeon: Rolm Bookbinder, MD;  Location: [facility];  Service: General;  Laterality: Right;  . Simple mastectomy with axillary sentinel node biopsy Right 12/21/2014    Procedure: RIGHT SKIN SPARING MASTECTOMY WITH RIGHT  AXILLARY SENTINEL LYMPH NODE BIOPSY;  Surgeon: Rolm Bookbinder, MD;  Location: [facility];  Service: General;  Laterality: Right;  . Breast reconstruction with placement of tissue expander and flex hd (acellular hydrated dermis) Right 12/21/2014    Procedure: RIGHT  BREAST RECONSTRUCTION WITH PLACEMENT OF TISSUE EXPANDER AND  ACELLULAR DERMIS;  Surgeon: Irene Limbo, MD;  Location: [facility];  Service: Plastics;  Laterality: Right;    There were no vitals filed for this visit.  Visit Diagnosis:  Stiffness of joint, shoulder region, right  Pain in joint involving shoulder region, right  Impaired function of upper  extremity  At risk for lymphedema      Subjective Assessment - 04/15/15 1022    Subjective finished with radiation, experiencing fatigue, skin is OK  wants to contiue with Pilates for core strengthening to add to her home program  she has not got into a structured exercise program yet but is doing stretches throughout her day    Pertinent History Right breast cancer diagnosed 11/10/14; mastectomy 12/21/14; second surgery to remove more skin 01/25/15.  Expander was placed at first surgery, and has been filled three times.  Radiation complete July 26   Will not have to have chemo.   Six lymph nodes removed, all negative.   No other significan health issues.                                                                               Patient Stated Goals get strength back in the arm and be able to move it, range of motion   Currently in Pain? No/denies            [facility] PT Assessment - 04/15/15 0001    AROM  Right Shoulder Flexion 163 Degrees   Right Shoulder ABduction 164 Degrees   Right Shoulder Internal Rotation 85 Degrees   Right Shoulder External Rotation 90 Degrees                     [facility] Adult PT Treatment/Exercise - 04/15/15 0001    Elbow Exercises   Elbow Extension Strengthening;10 reps;Right;Supine;Bar weights/barbell  3 sets of 10   Lumbar Exercises: Supine   Other Supine Lumbar Exercises lower trunk rotation, marches, knee to chest , heel and toe raises to prepare for exercise., then lower trunk rotation with goal post arms to stretch radiation field   Shoulder Exercises: Supine   Horizontal ABduction AROM;5 reps  for stretch    External Rotation 5 reps   Flexion AROM;Both;5 reps  for stretch    Other Supine Exercises hand to ceiling small slow circles,  reps in each direction    Shoulder Exercises: Seated   Other Seated Exercises "poishing the halo"  with effort to try to make shoudler and arms symmtrical with scapula stabalized    Shoulder Exercises:  Sidelying   Other Sidelying Exercises telescoping arms resisted flexion and extension with green theraband    Other Sidelying Exercises arm stretch overhead   difficult and painful for patient   Shoulder Exercises: Standing   Other Standing Exercises both arms raised overhead and scapula stabalized down back., stand on one leg for 10 sec.   Manual Therapy   Myofascial Release gentle "pulling" to right shoulder    Passive ROM to right shoulder with stretch to patient tolerance for ER, abduction, and flexion                PT Education - 04/15/15 1224    Education provided Yes   Education Details endrange shoulder range of motion with  core exercise in sitting and  standing    Person(s) Educated Patient   Methods Explanation;Demonstration   Comprehension Verbalized understanding;Returned demonstration;Need further instruction                [facility] Goals - 04/15/15 1230    CC Long Term Goal  #1   Title right shoulder active flexion to at least 170 degrees for improved overhead reach   Status On-going   CC Long Term Goal  #2   Title right shoulder active abduction to at least 170 degrees   Status On-going   CC Long Term Goal  #3   Title right shoulder active ER 90 degrees   Status Achieved   CC Long Term Goal  #4   Title independent with HEP for right shoulder ROM and strengthening   Status On-going   CC Long Term Goal  #5   Title knowledgeable about lymphedema risk reduction practices   Status Achieved            Plan - 04/15/15 1226    Clinical Impression Statement Pt completed radiation last week and admits she has not been doing any exercise since. Encouraged her to resume walking program to help with fatigue and resume end range shoulder stretching.  She has achieved goal for right shoulder external rotation, but not for flexion or abduction. She would benefit from continued session for Pilates as she needs core strenthening and for work on  achieveing end range of shoulder motion.    Clinical Impairments Affecting Rehab Potential completed radiation with healing skin in radiation field   PT Next Visit Plan Core strengthening and education  exercise and manual techniques for end range shoulder range of motion    Consulted and Agree with Plan of Care Patient        Problem List Patient Active Problem List   Diagnosis Date Noted  . DCIS (ductal carcinoma in situ) of breast 12/21/2014  . Family history of malignant neoplasm of gastrointestinal tract 11/26/2014  . 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 Migraines 11/23/2014   Donato Heinz. Owens Shark, PT  04/15/2015, 12:31 PM  [place], [place], [address] Phone: [phone]   Fax:  [phone]

## 2015-04-21 ENCOUNTER — Encounter: Payer: Self-pay | Admitting: Physical Therapy

## 2015-04-21 ENCOUNTER — Ambulatory Visit: Payer: BLUE CROSS/BLUE SHIELD | Admitting: Physical Therapy

## 2015-04-21 DIAGNOSIS — M25611 Stiffness of right shoulder, not elsewhere classified: Secondary | ICD-10-CM | POA: Diagnosis not present

## 2015-04-21 DIAGNOSIS — R6889 Other general symptoms and signs: Secondary | ICD-10-CM

## 2015-04-21 DIAGNOSIS — M25511 Pain in right shoulder: Secondary | ICD-10-CM

## 2015-04-21 DIAGNOSIS — Z9189 Other specified personal risk factors, not elsewhere classified: Secondary | ICD-10-CM

## 2015-04-21 NOTE — Patient Instructions (Signed)
Patient given exercises for core strength and stability: Taken from file cabinet due to time constraints.  Quadruped (bird dog) L stab 1: clam, table top legs and toe taps Advised to do planks and avoid crunches, sit ups Emphasize neutral spine

## 2015-04-21 NOTE — Therapy (Signed)
Loretta Sanchez, [place], [address] Phone: [phone]   Fax:  [phone]  Physical Therapy Treatment  Patient Details  Name: Loretta Sanchez MRN: [id number] Date of Birth: 05/06/1976 Referring Provider:  Irene Limbo, MD  Encounter Date: 04/21/2015      PT End of Session - 04/21/15 0904    Visit Number 10   Number of Visits 13   Date for PT Re-Evaluation 04/26/15   PT Start Time 0845   PT Stop Time 0940   PT Time Calculation (min) 55 min   Activity Tolerance Patient tolerated treatment well;No increased pain      Past Medical History  Diagnosis Date  . Chronic vaginitis   . Vulvar vestibulitis   . Migraines   . Cancer     right breast cancer  . Heart murmur     as an infant  . Family history of adverse reaction to anesthesia     mom has n/v and difficulty waking up    Past Surgical History  Procedure Laterality Date  . Wisdom tooth extraction    . Dilation and curettage of uterus    . Re-excision of breast cancer,superior margins Right 01/25/2015    Procedure: EXCISION OF SKIN AND SOFT TISSUE RIGHT BREAST WITH TISSUE EXPANSION;  Surgeon: Rolm Bookbinder, MD;  Location: [facility];  Service: General;  Laterality: Right;  . Simple mastectomy with axillary sentinel node biopsy Right 12/21/2014    Procedure: RIGHT SKIN SPARING MASTECTOMY WITH RIGHT  AXILLARY SENTINEL LYMPH NODE BIOPSY;  Surgeon: Rolm Bookbinder, MD;  Location: [facility];  Service: General;  Laterality: Right;  . Breast reconstruction with placement of tissue expander and flex hd (acellular hydrated dermis) Right 12/21/2014    Procedure: RIGHT  BREAST RECONSTRUCTION WITH PLACEMENT OF TISSUE EXPANDER AND  ACELLULAR DERMIS;  Surgeon: Irene Limbo, MD;  Location: [facility];  Service: Plastics;  Laterality: Right;    There were no vitals filed for this visit.  Visit Diagnosis:  Stiffness of joint, shoulder region, right  Pain in joint  involving shoulder region, right  Impaired function of upper extremity  At risk for lymphedema      Subjective Assessment - 04/21/15 0852    Subjective No c/o today, was sore for a few days following last PIlates session which she liked. Has been tired since her radiation treatment.        Pilates Tower for LE/Core strength, postural strength, lumbopelvic disassociation and core control.  Exercises included:  Roll down yellow bar, press down x 5, roll back/up x 10  Scapular glides 2 red bilateral with bar for scapular stability, single arm protract/retract each side  x10 Cat stretch (straddle tower) 1 red Chest expansion/thigh stretch x 10  Supine Arms with yellow Arcs, T and small circles x 10 each Supine stabilization to include L spine and ball under sacrum: Dead bug, single arm, single leg and diagonal patten Kneeling arms facing back: row, extension yellow springs Kneeling arms facing side: ER (yellow) and diagonal pull (slastix) x 10 each side Kneeling arms facing front in tall 1/2 kneeling: serving x 10 and fly x 10 with yellow springs, switched legs and narrowed BOS for challenge.    Demo of HEP table top and education regarding neutral spine, Transverse Abd.          PT Education - 04/21/15 1340    Education provided Yes   Education Details HEP for core, safe   Person(s) Educated Patient  Methods Explanation;Demonstration;Handout   Comprehension Verbalized understanding;Returned demonstration          [facility] Goals - 04/15/15 1230    CC Long Term Goal  #1   Title right shoulder active flexion to at least 170 degrees for improved overhead reach   Status On-going   CC Long Term Goal  #2   Title right shoulder active abduction to at least 170 degrees   Status On-going   CC Long Term Goal  #3   Title right shoulder active ER 90 degrees   Status Achieved   CC Long Term Goal  #4   Title independent with HEP for right shoulder ROM and strengthening    Status On-going   CC Long Term Goal  #5   Title knowledgeable about lymphedema risk reduction practices   Status Achieved            Plan - 04/21/15 1340    Clinical Impression Statement Patient receptive to Pilates exercises and intervention.  She demonstrates core weakness, scapula is protracted slightly and is weaker overall in Rt. UE.  She was able to achieve a good stretch with push through bar [employer])  and was shown how to mimick at home. She would benefit from continued core conditioning program and may consider post-PT Pilates.  She has one more visit than will be DC.    PT Next Visit Plan Core strengthening and education exercise and manual techniques for end range shoulder range of motion    PT Home Exercise Plan Do strength ABC program once before next session here. Given Core HEP see above.    Consulted and Agree with Plan of Care Patient        Problem List Patient Active Problem List   Diagnosis Date Noted  . DCIS (ductal carcinoma in situ) of breast 12/21/2014  . Family history of malignant neoplasm of gastrointestinal tract 11/26/2014  . 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 Migraines 11/23/2014    [name] 04/21/2015, 1:45 PM  [facility] [address] [place], [place], [address] Phone: [phone]   Fax:  [phone]    Raeford Razor, PT 04/21/2015 2:55 PM Phone: [phone] Fax: [phone]

## 2015-04-22 ENCOUNTER — Ambulatory Visit: Payer: BLUE CROSS/BLUE SHIELD | Admitting: Physical Therapy

## 2015-04-22 DIAGNOSIS — M25611 Stiffness of right shoulder, not elsewhere classified: Secondary | ICD-10-CM

## 2015-04-22 DIAGNOSIS — R6889 Other general symptoms and signs: Secondary | ICD-10-CM

## 2015-04-22 DIAGNOSIS — M25511 Pain in right shoulder: Secondary | ICD-10-CM

## 2015-04-22 DIAGNOSIS — Z9189 Other specified personal risk factors, not elsewhere classified: Secondary | ICD-10-CM

## 2015-04-22 NOTE — Therapy (Signed)
[place], [place], [address] Phone: [phone]   Fax:  [phone]  Physical Therapy Treatment  Patient Details  Name: Loretta Sanchez MRN: [id number] Date of Birth: June 05, 1976 Referring Provider:  Irene Limbo, MD  Encounter Date: 04/22/2015      PT End of Session - 04/22/15 1549    Visit Number 11   Number of Visits 13   Date for PT Re-Evaluation 04/26/15   PT Start Time 1505   PT Stop Time 1545   PT Time Calculation (min) 40 min   Activity Tolerance Patient tolerated treatment well   Behavior During Therapy [facility] for tasks assessed/performed      Past Medical History  Diagnosis Date  . Chronic vaginitis   . Vulvar vestibulitis   . Migraines   . Cancer     right breast cancer  . Heart murmur     as an infant  . Family history of adverse reaction to anesthesia     mom has n/v and difficulty waking up    Past Surgical History  Procedure Laterality Date  . Wisdom tooth extraction    . Dilation and curettage of uterus    . Re-excision of breast cancer,superior margins Right 01/25/2015    Procedure: EXCISION OF SKIN AND SOFT TISSUE RIGHT BREAST WITH TISSUE EXPANSION;  Surgeon: Rolm Bookbinder, MD;  Location: [place];  Service: General;  Laterality: Right;  . Simple mastectomy with axillary sentinel node biopsy Right 12/21/2014    Procedure: RIGHT SKIN SPARING MASTECTOMY WITH RIGHT  AXILLARY SENTINEL LYMPH NODE BIOPSY;  Surgeon: Rolm Bookbinder, MD;  Location: [facility];  Service: General;  Laterality: Right;  . Breast reconstruction with placement of tissue expander and flex hd (acellular hydrated dermis) Right 12/21/2014    Procedure: RIGHT  BREAST RECONSTRUCTION WITH PLACEMENT OF TISSUE EXPANDER AND  ACELLULAR DERMIS;  Surgeon: Irene Limbo, MD;  Location: [facility];  Service: Plastics;  Laterality: Right;    There were no vitals filed for this visit.  Visit Diagnosis:  Stiffness of  joint, shoulder region, right  Pain in joint involving shoulder region, right  Impaired function of upper extremity  At risk for lymphedema      Subjective Assessment - 04/22/15 1525    Subjective Feeling good.  She has to wait for 6 months until she can get implants in place.             [facility] PT Assessment - 04/22/15 0001    AROM   Right Shoulder Flexion 170 Degrees   Right Shoulder ABduction 172 Degrees           LYMPHEDEMA/ONCOLOGY QUESTIONNAIRE - 04/22/15 1534    Right Upper Extremity Lymphedema   10 cm Proximal to Olecranon Process 27.5 cm   Olecranon Process 25 cm   10 cm Proximal to Ulnar Styloid Process 21.6 cm   Just Proximal to Ulnar Styloid Process 15.5 cm   Across Hand at [employer] 19 cm   At [place] of 2nd Digit 5.8 cm           Quick Dash - 04/22/15 0001    Open a tight or new jar No difficulty   Do heavy household chores (wash walls, wash floors) No difficulty   Carry a shopping bag or briefcase No difficulty   Wash your back No difficulty   Use a knife to cut food No difficulty   Recreational activities in which you take some force or  impact through your arm, shoulder, or hand (golf, hammering, tennis) No difficulty   During the past week, to what extent has your arm, shoulder or hand problem interfered with your normal social activities with family, friends, neighbors, or groups? Not at all   During the past week, to what extent has your arm, shoulder or hand problem limited your work or other regular daily activities Not at all   Arm, shoulder, or hand pain. None   Tingling (pins and needles) in your arm, shoulder, or hand None   Difficulty Sleeping No difficulty   DASH Score 0 %               [facility] Adult PT Treatment/Exercise - 04/22/15 0001    Lumbar Exercises: Supine   Other Supine Lumbar Exercises on soft foam roller: mini marches, alternating arms, arms out wide   Shoulder Exercises: Sidelying   Other Sidelying Exercises  arm stretch overhead   manual overstretch applied at end ranges of motion    Shoulder Exercises: Standing   Other Standing Exercises both arms raised overhead and scapula stabalized down back., stand on one leg for 10 sec.   Other Standing Exercises yellow ball up the wall                 PT Education - 04/22/15 1545    Education provided Yes   Education Details [place]wood exercise for scapular work, black tband for progression   Person(s) Educated Patient   Methods Explanation;Demonstration;Handout   Comprehension Verbalized understanding;Returned demonstration                [facility] Goals - 04/22/15 1545    CC Long Term Goal  #1   Title right shoulder active flexion to at least 170 degrees for improved overhead reach   Status Achieved   CC Long Term Goal  #2   Title right shoulder active abduction to at least 170 degrees   Status Achieved   CC Long Term Goal  #3   Title right shoulder active ER 90 degrees   Status Achieved   CC Long Term Goal  #4   Title independent with HEP for right shoulder ROM and strengthening   Status Achieved   CC Long Term Goal  #5   Title knowledgeable about lymphedema risk reduction practices   Status Achieved            Plan - 04/22/15 1550    Clinical Impression Statement Loretta Sanchez is doing very well.  She has acheived all her goals and is ready to discharge from PT.  Arms remeasured with decrease in right hand and wrist, otherwise no change in measurements.  She has plans to get  a Class One compression sleeve at [facility] for prophylactic use.  She plans to continue her exerc ise porgram on her own.    Clinical Impairments Affecting Rehab Potential completed radiation with healing skin in radiation field   PT Next Visit Plan Discharge this visit.   Consulted and Agree with Plan of Care Patient        Problem List Patient Active Problem List   Diagnosis Date Noted  . DCIS (ductal carcinoma in situ) of  breast 12/21/2014  . Family history of malignant neoplasm of gastrointestinal tract 11/26/2014  . 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 Migraines 11/23/2014  PHYSICAL THERAPY DISCHARGE SUMMARY  Visits from Start of Care: 11  Current functional level related to goals /  functional outcomes: Pt is functionally independent   Remaining deficits: Mild end range right shoulder tightness especailly at pec area   Education / Equipment: Lymphedema risk reduction, strength and range of motion home exercise program, plans to get class one compression sleeve  Plan: Patient agrees to discharge.  Patient goals were partially met. Patient is being discharged due to meeting the stated rehab goals.  ?????        Donato Heinz. Owens Shark, PT  04/22/2015, 3:54 PM  [place], [place], [address] Phone: [phone]   Fax:  [phone]

## 2015-04-22 NOTE — Patient Instructions (Signed)
Strengthening: Resisted Internal Rotation   Hold tubing in right  hand, elbow at side and forearm out. Rotate forearm in across body. Repeat _10___ times per set. Do ___up to 3 _ sets per session. Do _1___ sessions per day.  http://orth.exer.us/830   Copyright  VHI. All rights reserved.  Strengthening: Resisted External Rotation   Hold tubing in right hand, elbow at side and forearm across body. Rotate forearm out. Repeat _10___ times per set. Do _up to 3 ___ sets per session. Do __1__ sessions per day.  http://orth.exer.us/828   Copyright  VHI. All rights reserved.  Strengthening: Resisted Flexion   Hold tubing with right  arm at side. Pull forward and up. Move shoulder through pain-free range of motion. Repeat _10 ___ times per set. Do __1-3__ sets per session. Do _1___ sessions per day.  http://orth.exer.us/824   Copyright  VHI. All rights reserved.  Strengthening: Resisted Extension   Hold tubing in right hand, arm forward. Pull arm back, elbow straight. Repeat 10 ____ times per set. Do __1-3__ sets per session. Do __1__ sessions per day.  http://orth.exer.us/832   Copyright  VHI. All rights reserved.

## 2015-04-23 NOTE — H&P (Addendum)
Loretta Sanchez is an 39 y.o. female with irregular vb, h/o breast cancer - on tamoxifen presents for surgical evaluation and treatment of endometrial polyp.    * No LMP recorded.    Past Medical History  Diagnosis Date  . Chronic vaginitis   . Vulvar vestibulitis   . Migraines   . Cancer     right breast cancer  . Heart murmur     as an infant  . Family history of adverse reaction to anesthesia     mom has n/v and difficulty waking up    Past Surgical History  Procedure Laterality Date  . Wisdom tooth extraction    . Dilation and curettage of uterus    . Re-excision of breast cancer,superior margins Right 01/25/2015    Procedure: EXCISION OF SKIN AND SOFT TISSUE RIGHT BREAST WITH TISSUE EXPANSION;  Surgeon: Rolm Bookbinder, MD;  Location: [facility];  Service: General;  Laterality: Right;  . Simple mastectomy with axillary sentinel node biopsy Right 12/21/2014    Procedure: RIGHT SKIN SPARING MASTECTOMY WITH RIGHT  AXILLARY SENTINEL LYMPH NODE BIOPSY;  Surgeon: Rolm Bookbinder, MD;  Location: [facility];  Service: General;  Laterality: Right;  . Breast reconstruction with placement of tissue expander and flex hd (acellular hydrated dermis) Right 12/21/2014    Procedure: RIGHT  BREAST RECONSTRUCTION WITH PLACEMENT OF TISSUE EXPANDER AND  ACELLULAR DERMIS;  Surgeon: Irene Limbo, MD;  Location: [facility];  Service: Plastics;  Laterality: Right;    Family History  Problem Relation Age of Onset  . Hypertension Mother   . Hypertension Father   . Hypertension Maternal Grandmother   . Cancer Maternal Grandmother 81    primary stomach cancer  . Hypertension Maternal Grandfather   . Hypertension Paternal Grandmother   . Hypertension Paternal Grandfather   . Diabetes Paternal Grandfather   . Cancer Maternal Uncle 85    primary bone cancer  . Cancer Other 64    mat great aunt (MGM sister) with primary stomach cancer    Social History:  reports that she has never smoked. She has never  used smokeless tobacco. She reports that she does not drink alcohol or use illicit drugs.  Allergies:  Allergies  Allergen Reactions  . Other Rash    Steri-strips    No prescriptions prior to admission    ROS  There were no vitals taken for this visit. Physical Exam  Gen - NAD Abd - soft, NT/ND CV - RRR Lungs - clear Ext - NT, no edema PV - uterus mobile NT.  No adnexal masses  [place]:  106mm intracavitary mass.  No adnexal masses or free fluid  Assessment/Plan: Irregular menses, h/o breast cancer - on tamoxifen, endometrial polyp Hysteroscopy D&C, resection of polyp R/b/a discussed, questions answered, informed consent  Loretta Sanchez 04/23/2015, 11:50 AM

## 2015-04-28 ENCOUNTER — Encounter (HOSPITAL_COMMUNITY): Payer: Self-pay | Admitting: *Deleted

## 2015-04-29 ENCOUNTER — Ambulatory Visit (HOSPITAL_COMMUNITY)
Admission: RE | Disposition: A | Discharge status: Home / Self Care | Payer: BLUE CROSS/BLUE SHIELD | Source: Ambulatory Visit | Attending: Obstetrics and Gynecology

## 2015-04-29 ENCOUNTER — Encounter (HOSPITAL_COMMUNITY): Payer: Self-pay | Admitting: *Deleted

## 2015-04-29 ENCOUNTER — Ambulatory Visit (HOSPITAL_COMMUNITY): Payer: BLUE CROSS/BLUE SHIELD | Admitting: Anesthesiology

## 2015-04-29 ENCOUNTER — Ambulatory Visit (HOSPITAL_COMMUNITY)
Admission: RE | Admit: 2015-04-29 | Discharge: 2015-04-29 | Disposition: A | Discharge status: Home / Self Care | Payer: BLUE CROSS/BLUE SHIELD | Source: Ambulatory Visit | Attending: Obstetrics and Gynecology | Admitting: Obstetrics and Gynecology

## 2015-04-29 DIAGNOSIS — Z79810 Long term (current) use of selective estrogen receptor modulators (SERMs): Secondary | ICD-10-CM | POA: Diagnosis not present

## 2015-04-29 DIAGNOSIS — Z853 Personal history of malignant neoplasm of breast: Secondary | ICD-10-CM | POA: Insufficient documentation

## 2015-04-29 DIAGNOSIS — N939 Abnormal uterine and vaginal bleeding, unspecified: Secondary | ICD-10-CM | POA: Insufficient documentation

## 2015-04-29 DIAGNOSIS — N920 Excessive and frequent menstruation with regular cycle: Secondary | ICD-10-CM | POA: Diagnosis not present

## 2015-04-29 DIAGNOSIS — N840 Polyp of corpus uteri: Secondary | ICD-10-CM | POA: Diagnosis present

## 2015-04-29 HISTORY — DX: Other specified postprocedural states: Z98.890

## 2015-04-29 HISTORY — DX: Nausea with vomiting, unspecified: R11.2

## 2015-04-29 HISTORY — PX: DILATATION & CURETTAGE/HYSTEROSCOPY WITH MYOSURE: SHX6511

## 2015-04-29 LAB — CBC
HCT: 36 % (ref 36.0–46.0)
Hemoglobin: 12.5 g/dL (ref 12.0–15.0)
MCH: 31.8 pg (ref 26.0–34.0)
MCHC: 34.7 g/dL (ref 30.0–36.0)
MCV: 91.6 fL (ref 78.0–100.0)
PLATELETS: 207 10*3/uL (ref 150–400)
RBC: 3.93 MIL/uL (ref 3.87–5.11)
RDW: 12 % (ref 11.5–15.5)
WBC: 5.2 10*3/uL (ref 4.0–10.5)

## 2015-04-29 SURGERY — DILATATION & CURETTAGE/HYSTEROSCOPY WITH MYOSURE
Anesthesia: Choice

## 2015-04-29 MED ORDER — TRAMADOL HCL 50 MG PO TABS: 50.0000 mg | ORAL_TABLET | Freq: Four times a day (QID) | ORAL | Status: DC | PRN

## 2015-04-29 MED ORDER — PROPOFOL 10 MG/ML IV BOLUS
INTRAVENOUS | Status: AC
  Filled 2015-04-29: qty 20

## 2015-04-29 MED ORDER — LIDOCAINE HCL 1 % IJ SOLN
INTRAMUSCULAR | Status: AC
  Filled 2015-04-29: qty 20

## 2015-04-29 MED ORDER — SCOPOLAMINE 1 MG/3DAYS TD PT72
1.0000 | MEDICATED_PATCH | Freq: Once | TRANSDERMAL | Status: DC
  Administered 2015-04-29: 1.5 mg via TRANSDERMAL

## 2015-04-29 MED ORDER — DEXAMETHASONE SODIUM PHOSPHATE 10 MG/ML IJ SOLN
INTRAMUSCULAR | Status: DC | PRN
  Administered 2015-04-29: 10 mg via INTRAVENOUS

## 2015-04-29 MED ORDER — FENTANYL CITRATE (PF) 100 MCG/2ML IJ SOLN
INTRAMUSCULAR | Status: AC
  Filled 2015-04-29: qty 2

## 2015-04-29 MED ORDER — FENTANYL CITRATE (PF) 100 MCG/2ML IJ SOLN
INTRAMUSCULAR | Status: AC
  Filled 2015-04-29: qty 4

## 2015-04-29 MED ORDER — MIDAZOLAM HCL 5 MG/5ML IJ SOLN
INTRAMUSCULAR | Status: DC | PRN
  Administered 2015-04-29: 2 mg via INTRAVENOUS

## 2015-04-29 MED ORDER — KETOROLAC TROMETHAMINE 60 MG/2ML IM SOLN
INTRAMUSCULAR | Status: DC | PRN
  Administered 2015-04-29: 30 mg via INTRAMUSCULAR

## 2015-04-29 MED ORDER — LIDOCAINE HCL 1 % IJ SOLN
INTRAMUSCULAR | Status: DC | PRN
  Administered 2015-04-29: 10 mL

## 2015-04-29 MED ORDER — KETAMINE HCL 10 MG/ML IJ SOLN
INTRAMUSCULAR | Status: DC | PRN
  Administered 2015-04-29: 20 mg via INTRAVENOUS

## 2015-04-29 MED ORDER — CEFAZOLIN SODIUM-DEXTROSE 2-3 GM-% IV SOLR
INTRAVENOUS | Status: AC
  Filled 2015-04-29: qty 50

## 2015-04-29 MED ORDER — LACTATED RINGERS IV SOLN
INTRAVENOUS | Status: DC
  Administered 2015-04-29 (×2): via INTRAVENOUS

## 2015-04-29 MED ORDER — KETAMINE HCL 10 MG/ML IJ SOLN
INTRAMUSCULAR | Status: AC
  Filled 2015-04-29: qty 20

## 2015-04-29 MED ORDER — LIDOCAINE HCL (CARDIAC) 20 MG/ML IV SOLN
INTRAVENOUS | Status: AC
  Filled 2015-04-29: qty 5

## 2015-04-29 MED ORDER — SCOPOLAMINE 1 MG/3DAYS TD PT72
MEDICATED_PATCH | TRANSDERMAL | Status: AC
  Administered 2015-04-29: 1.5 mg via TRANSDERMAL
  Filled 2015-04-29: qty 1

## 2015-04-29 MED ORDER — ONDANSETRON HCL 4 MG/2ML IJ SOLN
INTRAMUSCULAR | Status: AC
  Filled 2015-04-29: qty 2

## 2015-04-29 MED ORDER — CEFAZOLIN SODIUM-DEXTROSE 2-3 GM-% IV SOLR
2.0000 g | INTRAVENOUS | Status: AC
  Administered 2015-04-29: 2 g via INTRAVENOUS

## 2015-04-29 MED ORDER — FENTANYL CITRATE (PF) 100 MCG/2ML IJ SOLN
25.0000 ug | INTRAMUSCULAR | Status: DC | PRN
  Administered 2015-04-29: 50 ug via INTRAVENOUS

## 2015-04-29 MED ORDER — ONDANSETRON HCL 4 MG/2ML IJ SOLN
INTRAMUSCULAR | Status: DC | PRN
  Administered 2015-04-29: 4 mg via INTRAVENOUS

## 2015-04-29 MED ORDER — PROPOFOL 10 MG/ML IV BOLUS
INTRAVENOUS | Status: DC | PRN
  Administered 2015-04-29: 160 mg via INTRAVENOUS

## 2015-04-29 MED ORDER — ACETAMINOPHEN 160 MG/5ML PO SOLN
ORAL | Status: AC
  Administered 2015-04-29: 975 mg via ORAL
  Filled 2015-04-29: qty 40.6

## 2015-04-29 MED ORDER — LIDOCAINE HCL (CARDIAC) 20 MG/ML IV SOLN
INTRAVENOUS | Status: DC | PRN
  Administered 2015-04-29: 50 mg via INTRAVENOUS

## 2015-04-29 MED ORDER — DEXAMETHASONE SODIUM PHOSPHATE 10 MG/ML IJ SOLN
INTRAMUSCULAR | Status: AC
  Filled 2015-04-29: qty 1

## 2015-04-29 MED ORDER — KETOROLAC TROMETHAMINE 30 MG/ML IJ SOLN
INTRAMUSCULAR | Status: DC | PRN
  Administered 2015-04-29: 30 mg via INTRAVENOUS

## 2015-04-29 MED ORDER — ACETAMINOPHEN 160 MG/5ML PO SOLN
975.0000 mg | Freq: Once | ORAL | Status: AC
  Administered 2015-04-29: 975 mg via ORAL

## 2015-04-29 MED ORDER — MIDAZOLAM HCL 2 MG/2ML IJ SOLN
INTRAMUSCULAR | Status: AC
  Filled 2015-04-29: qty 4

## 2015-04-29 SURGICAL SUPPLY — 23 items
ABLATOR ENDOMETRIAL BIPOLAR (ABLATOR) IMPLANT
CANISTER SUCT 3000ML (MISCELLANEOUS) ×2 IMPLANT
CATH ROBINSON RED A/P 16FR (CATHETERS) ×2 IMPLANT
CATH THERMACHOICE III (CATHETERS) IMPLANT
CLOTH BEACON ORANGE TIMEOUT ST (SAFETY) ×2 IMPLANT
CONTAINER PREFILL 10% NBF 60ML (FORM) ×2 IMPLANT
DEVICE MYOSURE CLASSIC (MISCELLANEOUS) IMPLANT
DEVICE MYOSURE LITE (MISCELLANEOUS) IMPLANT
ELECT REM PT RETURN 9FT ADLT (ELECTROSURGICAL) ×2
ELECTRODE REM PT RTRN 9FT ADLT (ELECTROSURGICAL) ×1 IMPLANT
FILTER ARTHROSCOPY CONVERTOR (FILTER) ×2 IMPLANT
GLOVE BIO SURGEON STRL SZ 6.5 (GLOVE) ×2 IMPLANT
GLOVE BIOGEL PI IND STRL 7.0 (GLOVE) ×1 IMPLANT
GLOVE BIOGEL PI INDICATOR 7.0 (GLOVE) ×1
GOWN STRL REUS W/TWL LRG LVL3 (GOWN DISPOSABLE) ×4 IMPLANT
LOOP ANGLED CUTTING 22FR (CUTTING LOOP) IMPLANT
PACK VAGINAL MINOR WOMEN LF (CUSTOM PROCEDURE TRAY) ×2 IMPLANT
PAD OB MATERNITY 4.3X12.25 (PERSONAL CARE ITEMS) ×2 IMPLANT
SEAL ROD LENS SCOPE MYOSURE (ABLATOR) ×2 IMPLANT
TOWEL OR 17X24 6PK STRL BLUE (TOWEL DISPOSABLE) ×4 IMPLANT
TUBING AQUILEX INFLOW (TUBING) ×2 IMPLANT
TUBING AQUILEX OUTFLOW (TUBING) ×2 IMPLANT
WATER STERILE IRR 1000ML POUR (IV SOLUTION) ×2 IMPLANT

## 2015-04-29 NOTE — Transfer of Care (Signed)
Immediate Anesthesia Transfer of Care Note  Patient: Loretta Sanchez  Procedure(s) Performed: Procedure(s): DILATATION & CURETTAGE/HYSTEROSCOPY  (N/A)  Patient Location: PACU  Anesthesia Type:General  Level of Consciousness: sedated  Airway & Oxygen Therapy: Patient Spontanous Breathing and Patient connected to nasal cannula oxygen  Post-op Assessment: Report given to RN and Post -op Vital signs reviewed and stable  Post vital signs: stable  Last Vitals:  Filed Vitals:   04/29/15 1224  BP: 123/77  Pulse: 72  Temp: 36.9 C  Resp: 18    Complications: No apparent anesthesia complications

## 2015-04-29 NOTE — Anesthesia Postprocedure Evaluation (Signed)
  Anesthesia Post-op Note  Patient: Loretta Sanchez  Procedure(s) Performed: Procedure(s): DILATATION & CURETTAGE/HYSTEROSCOPY  (N/A)  Patient Location: PACU  Anesthesia Type:General  Level of Consciousness: awake, alert  and oriented  Airway and Oxygen Therapy: Patient Spontanous Breathing  Post-op Pain: mild  Post-op Assessment: Post-op Vital signs reviewed, Patient's Cardiovascular Status Stable, Respiratory Function Stable, Patent Airway and No signs of Nausea or vomiting              Post-op Vital Signs: Reviewed and stable  Last Vitals:  Filed Vitals:   04/29/15 1400  BP: 135/87  Pulse: 95  Temp:   Resp: 17    Complications: No apparent anesthesia complications

## 2015-04-29 NOTE — Anesthesia Preprocedure Evaluation (Addendum)
Anesthesia Evaluation  Patient identified by MRN, date of birth, ID band Patient awake    Reviewed: Allergy & Precautions, H&P , Patient's Chart, lab work & pertinent test results, reviewed documented beta blocker date and time   Airway Mallampati: II  TM Distance: >3 FB Neck ROM: full    Dental no notable dental hx.    Pulmonary  breath sounds clear to auscultation  Pulmonary exam normal       Cardiovascular Rhythm:regular Rate:Normal     Neuro/Psych    GI/Hepatic   Endo/Other    Renal/GU      Musculoskeletal   Abdominal   Peds  Hematology   Anesthesia Other Findings PONV; avoid narc's till PACU if needed. Use multi-modal analgesics  Reproductive/Obstetrics                            Anesthesia Physical Anesthesia Plan  ASA: II  Anesthesia Plan:    Post-op Pain Management:    Induction: Intravenous  Airway Management Planned: LMA  Additional Equipment:   Intra-op Plan:   Post-operative Plan:   Informed Consent: I have reviewed the patients History and Physical, chart, labs and discussed the procedure including the risks, benefits and alternatives for the proposed anesthesia with the patient or authorized representative who has indicated his/her understanding and acceptance.   Dental Advisory Given and Dental advisory given  Plan Discussed with: CRNA and Surgeon  Anesthesia Plan Comments: (Discussed GA with LMA, possible sore throat, potential need to switch to ETT, N/V, pulmonary aspiration. Questions answered. )        Anesthesia Quick Evaluation

## 2015-04-29 NOTE — Anesthesia Procedure Notes (Signed)
Procedure Name: LMA Insertion Date/Time: 04/29/2015 1:01 PM Performed by: Barkley Boards L Pre-anesthesia Checklist: Patient identified, Emergency Drugs available and Suction available Patient Re-evaluated:Patient Re-evaluated prior to inductionOxygen Delivery Method: Circle system utilized Preoxygenation: Pre-oxygenation with 100% oxygen Intubation Type: IV induction Ventilation: Mask ventilation without difficulty LMA: LMA inserted LMA Size: 4.0 Number of attempts: 1 Placement Confirmation: positive ETCO2 and breath sounds checked- equal and bilateral Dental Injury: Teeth and Oropharynx as per pre-operative assessment

## 2015-04-29 NOTE — Discharge Instructions (Signed)

## 2015-04-30 ENCOUNTER — Encounter (HOSPITAL_COMMUNITY): Payer: Self-pay | Admitting: Obstetrics and Gynecology

## 2015-04-30 NOTE — Op Note (Signed)
NAMEASRA, Loretta Sanchez              ACCOUNT NO.:  [id number]  MEDICAL RECORD NO.:  [id number]  LOCATION:  [facility]                          FACILITY:  [facility]  PHYSICIAN:  Marylynn Pearson, MD    DATE OF BIRTH:  [birth year]  DATE OF PROCEDURE:  04/29/2015 DATE OF DISCHARGE:  04/29/2015                              OPERATIVE REPORT   PREOPERATIVE DIAGNOSES: 1. Irregular vaginal bleeding. 2. Suspect endometrial polyp.  POSTOPERATIVE DIAGNOSES: 1. Irregular vaginal bleeding. 2. Suspect endometrial polyp, path pending.  PROCEDURE: 1. Paracervical block. 2. Hysteroscopy D and C.  SURGEON:  Marylynn Pearson, MD.  ANESTHESIA:  General.  COMPLICATIONS:  None.  CONDITION:  Stable to recovery room.  SPECIMENS:  Endometrial curettings with polyps.  DESCRIPTION OF PROCEDURE:  The patient was taken to the operating room. After informed consent was obtained, she was given general anesthesia, placed in the dorsal lithotomy position using Allen stirrups.  Prepped and draped in sterile fashion.  In and out catheter was used to drain her bladder.  Bivalve speculum was placed in the vagina and 1% lidocaine was used to provide local anesthesia and a paracervical block.  Single- tooth tenaculum was attached to the anterior lip of the cervix.  The cervix was serially dilated.  The hysteroscope was inserted.  A survey was performed.  Bilateral ostia were visualized and appeared normal. She had some polypoid fluffy tissue on the left uterine wall. Hysteroscope was removed and a gentle curetting was performed throughout.  Hysteroscope was reinserted and a survey performed.  No other polypoid mass or tissue was noted.  Hysteroscope was removed, one last curetting performed.  All specimens on Telfa were passed off to be sent to Pathology.  Tenaculum was removed from the cervix.  Her cervix was hemostatic.  Speculum was removed.  She was extubated and taken to the recovery room in stable condition.  Sponge,  lap, needle, and instrument counts were correct x2.     Marylynn Pearson, MD     GA/MEDQ  D:  04/29/2015  T:  04/30/2015  Job:  [id number]

## 2015-05-03 ENCOUNTER — Telehealth: Payer: Self-pay | Admitting: Adult Health

## 2015-05-03 NOTE — Telephone Encounter (Signed)
I spoke with Ms. Gaspar Garbe regarding her eligibility to come see [place] in the Survivorship Clinic now that she has completed treatment for breast cancer.  She already has a routine follow-up visit scheduled with Dr. Pablo Ledger and asked if we could coordinate that visit.   Therefore, I have scheduled her for a Survivorship Care Plan visit on 05/12/15 at 1pm.  She will then see Dr. Pablo Ledger at 2pm on that same day.  I gave her instructions on where to check in for her appts and encouraged her to call us with any questions.   She is very interested in healthy eating and what food choices she should be making now as a cancer survivor.  I let her know that we would review that information during her survivorship visit and make a referral to our oncology dietician, if needed.    We look forward to participating in her care.   Mike Craze, NP [place] [phone]

## 2015-05-05 NOTE — Progress Notes (Signed)
  Radiation Oncology         [phone] ________________________________  Name: Loretta Sanchez MRN: [id number]  Date: 04/06/2015  DOB: 05/14/1976  End of Treatment Note  DIAGNOSIS:    ICD-9-CM ICD-10-CM   1. 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   She had some skin irritation and itching.  She had an appointment in OB/GYN for treatment of vaginal bleeding  Plan: The patient has completed radiation treatment. The patient will return to radiation oncology clinic for routine followup in one month. I advised them to call or return sooner if they have any questions or concerns related to their recovery or treatment.  ------------------------------------------------  Thea Silversmith, MD This document serves as a record of services personally performed by Thea Silversmith, MD. It was created on her behalf by Derek Mound, a trained medical scribe. The creation of this record is based on the scribe's personal observations and the provider's statements to them. This document has been checked and approved by the attending provider.

## 2015-05-06 ENCOUNTER — Ambulatory Visit: Payer: Self-pay | Admitting: Radiation Oncology

## 2015-05-12 ENCOUNTER — Ambulatory Visit
Admission: RE | Admit: 2015-05-12 | Discharge: 2015-05-12 | Disposition: A | Discharge status: Home / Self Care | Payer: BLUE CROSS/BLUE SHIELD | Source: Ambulatory Visit | Attending: Radiation Oncology | Admitting: Radiation Oncology

## 2015-05-12 ENCOUNTER — Ambulatory Visit (HOSPITAL_BASED_OUTPATIENT_CLINIC_OR_DEPARTMENT_OTHER): Payer: BLUE CROSS/BLUE SHIELD | Admitting: Nurse Practitioner

## 2015-05-12 ENCOUNTER — Encounter: Payer: Self-pay | Admitting: Radiation Oncology

## 2015-05-12 ENCOUNTER — Encounter: Payer: Self-pay | Admitting: Nurse Practitioner

## 2015-05-12 VITALS — BP 118/71 | HR 82 | Temp 98.3°F | Resp 18 | Ht 69.0 in | Wt 162.9 lb

## 2015-05-12 VITALS — BP 109/69 | HR 83 | Temp 98.3°F | Ht 69.0 in | Wt 163.0 lb

## 2015-05-12 DIAGNOSIS — Z79810 Long term (current) use of selective estrogen receptor modulators (SERMs): Secondary | ICD-10-CM

## 2015-05-12 DIAGNOSIS — Z853 Personal history of malignant neoplasm of breast: Secondary | ICD-10-CM | POA: Diagnosis not present

## 2015-05-12 DIAGNOSIS — C50411 Malignant neoplasm of upper-outer quadrant of right female breast: Secondary | ICD-10-CM

## 2015-05-12 NOTE — Progress Notes (Signed)
CLINIC:  Cancer Survivorship   REASON FOR VISIT:  Routine follow-up post-treatment for a recent history of breast cancer.  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   11/29/2014 Breast MRI Area of confluent non-masslike enhancement throughout the outer right breast extending over 9 cm. There were no abnormal appearing lymph nodes.  Left breast was unremarkable.   11/29/2014 Clinical Stage Stage 0: Tis N0   12/07/2014 -  Anti-estrogen oral therapy Tamoxifen 20 mg daily; began prior to surgery; held during radiation and resumed following completion of treatment. Planned duration of therapy 10 years ([place])   12/09/2014 Procedure Genetic testing: BRCA Plus&OvaNext reveal no clinically significant variant in ATM, BARD1, BRCA1, BRCA2, BRIP1, CDH1, CHEK2, EPCAM, MLH1, MRE11A, MSH2, MSH6, MUTYH, NBN, NF1, PALB2, PMS2, PTEN, RAD50, RAD51C, RAD51D, SMARCA4, STK11, and TP53.   12/21/2014 Definitive Surgery Right mastectomy with SLNB Donne Hazel): IDC, 0.8 cm, grade 1, HER2 negative (ratio 1.2), with high grade DCIS and lobular neoplasia.  No evidence of malignancy in 6 of 6 non-sentinel lymph nodes nor 1 of 1 sentinel nodes (total 7 nodes).     12/21/2014 Oncotype testing Oncotype attempted; insufficient carcinoma present for testing.   12/21/2014 Pathologic Stage Stage IIB: pT3 pN0   01/25/2015 Surgery Re-excision of right breast: no evidence of malignancy.   02/25/2015 - 04/06/2015 Radiation Therapy Adjuvant RT completed Pablo Ledger): Right chest wall 50.4 Gy over 28 fractions.   04/29/2015  Procedure Endometrial curettage Julien Girt): endometrial polyps with no evidence of hyperplasia or malignancy.    INTERVAL HISTORY:  Ms. Mask presents to the [facility] today for our initial meeting to review her survivorship care plan detailing her treatment course for breast cancer, as well as monitoring long-term side effects of that treatment, education regarding health maintenance, screening, and overall wellness and health promotion.     Overall, Ms. Zurcher reports feeling quite well since completing her radiation therapy approximately one month ago. She continues with fatigue, but states that it is much improved as compared to what it was like during her last few radiation treatments.  She denies any ongoing skin changes and is no longer using her radioplex.  She denies any problems with her expanders and states that she is due to undergo surgery after the first of the year with Dr. Iran Planas.  She reports that she has had no further vaginal bleeding following her procedure earlier this month and that Dr. Julien Girt continues to monitor her closely at this time.  She is taking her tamoxifen and reports some flushing at night, but denies true hot flashes.  She has had no cough nor shortness of breath, and denies any pain.  REVIEW OF SYSTEMS:  General: Denies fever, chills, or unintentional weight loss.  Cardiac: Denies palpitations, chest pain, and lower extremity edema.  Respiratory: Denies dyspnea on exertion.  GI: Denies abdominal pain, constipation, diarrhea, nausea, or vomiting.  GU: Denies dysuria, hematuria,vaginal discharge, or vaginal dryness.  Musculoskeletal: Denies joint or bone pain.  Neuro: Denies headache or recent falls. Denies peripheral neuropathy. Skin: Denies rash, pruritis, or open wounds.  Breast: Denies any new nodularity, masses, tenderness, nipple changes,  or nipple discharge in the left breast, nor changes on the right following her mastectomy / implant insertion.   Psych: Denies depression, anxiety, or insomnia.   A 14-point review of systems was completed and was negative, except as noted above.   ONCOLOGY TREATMENT TEAM:  1. Surgeon:  Dr. Donne [facility]el at [facility]  2. Medical Oncologist: Dr. Jana Hakim 3. Radiation Oncologist: Dr. Pablo Ledger    PAST MEDICAL/SURGICAL HISTORY:  Past Medical History  Diagnosis Date  . Chronic vaginitis   . Vulvar vestibulitis   . Migraines   . Cancer     right breast cancer  . Heart murmur     as an infant  . Family history of adverse reaction to anesthesia     mom has n/v and difficulty waking up  . PONV (postoperative nausea and vomiting)   . Vaginal delivery 2005, 2008  . Radiation 02/25/15-04/06/15    Right breast upper-outer breast   Past Surgical History  Procedure Laterality Date  . Wisdom tooth extraction    . Dilation and curettage of uterus    . Re-excision of breast cancer,superior margins Right 01/25/2015    Procedure: EXCISION OF SKIN AND SOFT TISSUE RIGHT BREAST WITH TISSUE EXPANSION;  Surgeon: Rolm Bookbinder, MD;  Location: [place];  Service: General;  Laterality: Right;  . Simple mastectomy with axillary sentinel node biopsy Right 12/21/2014    Procedure: RIGHT SKIN SPARING MASTECTOMY WITH RIGHT  AXILLARY SENTINEL LYMPH NODE BIOPSY;  Surgeon: Rolm Bookbinder, MD;  Location: [place];  Service: General;  Laterality: Right;  . Breast reconstruction with placement of tissue expander and flex hd (acellular hydrated dermis) Right 12/21/2014    Procedure: RIGHT  BREAST RECONSTRUCTION WITH PLACEMENT OF TISSUE EXPANDER AND  ACELLULAR DERMIS;  Surgeon: Irene Limbo, MD;  Location: [place];  Service: Plastics;  Laterality: Right;  . Dilatation & curettage/hysteroscopy with myosure N/A 04/29/2015    Procedure: DILATATION & CURETTAGE/HYSTEROSCOPY ;  Surgeon: Marylynn Pearson, MD;  Location: [facility];  Service: Gynecology;  Laterality: N/A;     ALLERGIES:  Allergies  Allergen Reactions  .  Other Rash    Steri-strips     CURRENT MEDICATIONS:  Current Outpatient Prescriptions on File Prior to Visit  Medication Sig Dispense Refill  . cetirizine (ZYRTEC) 10 MG tablet Take 10 mg by mouth daily as needed for allergies.    . Multiple Vitamins-Minerals (MULTIVITAMIN WITH MINERALS) tablet Take 1 tablet by mouth daily.    . tamoxifen (NOLVADEX) 20 MG tablet Take 1 tablet (20 mg total) by mouth daily. 90 tablet 3   No current facility-administered medications on file prior to visit.     ONCOLOGIC FAMILY HISTORY:  Family History  Problem Relation Age of Onset  . Hypertension Mother   . Hypertension Father   . Hypertension Maternal Grandmother   . Cancer Maternal Grandmother 81    primary stomach cancer  . Hypertension Maternal Grandfather   . Hypertension Paternal Grandmother   . Hypertension Paternal Grandfather   . Diabetes Paternal Grandfather   . Cancer Maternal Uncle 58    primary bone cancer  . Cancer Other 7    mat great aunt (MGM sister) with primary stomach cancer     GENETIC COUNSELING/TESTING: Yes, completed 11/12/2014.  BRCA Plus & OvaNext panels performed reveal no clinically significant variant in ATM, BARD1, BRCA1, BRCA2, BRIP1, CDH1, CHEK2, EPCAM, MLH1, MRE11A, MSH2, MSH6, MUTYH, NBN, NF1, PALB2, PMS2, PTEN, RAD50, RAD51C, RAD51D, SMARCA4, STK11, and TP53.  SOCIAL HISTORY:  [name] is married and lives with her family in [place], [place].  She has 2 children.  Ms. Madril is currently staying at home homeschooling her children.  She denies any current or history of tobacco, alcohol, or illicit drug use.     PHYSICAL EXAMINATION:  Vital Signs:   Filed Vitals:   05/12/15 1327  BP: 118/71  Pulse: 82  Temp: 98.3 F (36.8 C)  Resp: 18   General: Well-nourished, well-appearing female in no acute distress.  She is accompanied in clinic by her husband, son and daughter today.   HEENT: Head is atraumatic and normocephalic.  Pupils  equal and reactive to light and accomodation. Conjunctivae clear without exudate.  Sclerae anicteric. Oral mucosa is pink, moist, and intact without lesions.  Oropharynx is pink without lesions or erythema.  Lymph: No cervical, supraclavicular, infraclavicular, or axillary lymphadenopathy noted on palpation.  Cardiovascular: Regular rate and rhythm without murmurs, rubs, or gallops. Respiratory: Clear to auscultation bilaterally. Chest expansion symmetric without accessory muscle use on inspiration or expiration.  GI: Abdomen soft and round. No tenderness to palpation. Bowel sounds normoactive in 4 quadrants. No hepatosplenomegaly.   GU: Deferred.  Musculoskeletal: Full ROM noted in all extremities.  Neuro: No focal deficits. Steady gait.  Psych: Mood and affect normal and appropriate for situation.  Extremities: No edema, cyanosis, or clubbing.  Skin: Warm and dry. No open lesions noted.   LABORATORY DATA:  None for this visit.  DIAGNOSTIC IMAGING:  None for this visit.     ASSESSMENT AND PLAN:   1. History of breast cancer: Stage IIB invasive ductal carcinoma, ER+ / PR+, S/P lumpectomy with immediate implant reconstruction, S/P radiation, now on Tamoxifen as adjuvant endocrine therapy with no clinical symptoms suggestive of disease recurrence.  Ms. Bellefeuille will follow-up with her medical oncologist,  Dr. Jana Hakim in September 2016 with history and physical exam per surveillance protocol.  She will continue her anti-estrogen therapy with Tamoxifen and is tolerating it well with only minimal hot flushes. She was instructed to make Dr. Jana Hakim or myself aware if she begins to experience any other side effects of the medication and I could see her back in clinic to help manage those side effects, as needed. Though the incidence is low, there is an associated risk of endometrial cancer with anti-estrogen therapies like Tamoxifen, of which Ms. Everly is aware.  Ms. Bonanno was encouraged to contact  Dr. Jana Hakim, Dr. Julien Girt, or myself with any vaginal bleeding while taking Tamoxifen. Other side effects of Tamoxifen were again reviewed with her as well. A comprehensive survivorship care plan and treatment summary was reviewed with the patient today detailing her breast cancer diagnosis, treatment course, potential late/long-term effects of treatment, appropriate follow-up care with recommendations for the future, and patient education resources.  A copy of this summary, along with a letter will be sent to the patient's primary care provider and Dr. Julien Girt via in basket message after today's visit.  Ms. Wernette is welcome to return to the Survivorship Clinic in the future, as needed; no follow-up will be scheduled at this time.     2. Cancer screening:  Due to Ms. Sirmons's history and her age, she should receive screening for skin cancers and gynecologic cancers.  She will begin colonoscopy screening at age 58. The information and recommendations are listed on the patient's comprehensive care plan/treatment summary and were reviewed in detail with the patient.    3. Health maintenance and wellness promotion: Ms.  Mcconathy was encouraged to consume 5-7 servings of fruits and vegetables per day. We reviewed the "Nutrition Rainbow" handout, as well as the handout about "Nutrition for Breast Cancer Survivors."  She was also encouraged to engage in moderate to vigorous exercise for 30 minutes per day most days of the week. We discussed the [employer] fitness program, which is designed for cancer survivors to help them become more physically fit after cancer treatments.  She was instructed to limit her alcohol consumption and continue to abstain from tobacco use. Ms. Justen is considering referral to our cancer dietician for assistance with dietary changes.  She will let us know if she wants to proceed with that referral, which we are happy to facilitate.   4. Support services/counseling: It is not uncommon for  this period of the patient's cancer care trajectory to be one of many emotions and stressors.  We discussed an opportunity for her to participate in the next session of [facility] ("Finding Your New Normal") support group series designed for patients after they have completed treatment.   Ms. Etzkorn was encouraged to take advantage of our many other support services programs, support groups, and/or counseling in coping with her new life as a cancer survivor after completing anti-cancer treatment.  She was offered support today through active listening and expressive supportive counseling.  She was given information regarding our available services and encouraged to contact me with any questions or for help enrolling in any of our support group/programs.    A total of 45 minutes of face-to-face time was spent with this patient with greater than 50% of that time in counseling and care-coordination.   Sylvan Cheese, NP  Survivorship Program [place] [phone]   Note: PRIMARY CARE PROVIDER Jerlyn Ly, [place] [phone]

## 2015-05-12 NOTE — Progress Notes (Signed)
   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 The site and dose included the right chest wall at 50.4 Gray @ 1.8 Pearline Cables per fraction x 28 fractions. The beams and energy included opposed Tangents at 6 MV photons.  Interval History: Loretta Sanchez is a 39 year old female presenting to clinic today in regards to her routine follow up appointment with radiation oncology. The patient denies symptoms of pain and did not vocalized any new or concerning current symptoms. Her skin is healing up well. She has been following up with Dr. Emmie Niemann. A D&C was completed two weeks ago to help alleviate symptoms of heavy bleeding. She expressed that she was excited to receive her certificate and was accompanied by her husband for today's radiation oncology appointment.  Physical Exam: The patient is alert and oriented x3. Filed Vitals:   05/12/15 1424  BP: 109/69  Pulse: 83  Temp: 98.3 F (36.8 C)  Height: 5\' 9"  (1.753 m)  Weight: 163 lb (73.936 kg)  Right female breast: healing well with no sign of infection  IMPRESSION: Loretta Sanchez is a 39 y.o. female presenting to clinic in regards to her cancer of the right female breast. She is recovering well from the effects of radiation and her recent [place] procedure. There is no signs of disease reoccurrence. She is successfully managing her anti-estrogen therapy.  PLAN:  The patient will continue treatment as advised and the patient is  continuing the administration of her Tamoxifen pills. She vocalized specific questions in regards to what type of deodorant is best to use and other products that are safer to use in regards to her recovery. She is aware of her appointment with Dr. Jana Hakim in September of 2016 and appointment with Dr. Donne Hazel to take place in three months. The importance of wearing sun protection and participating in annual mammograms was emphasized. She is aware of her follow up appointment with radiation oncology to take place as scheduled. All vocalized questions and concerns were fully addressed. If she develops any further questions or concerns in regards to her treatment, she has been encouraged to contact Dr. Pablo Ledger.   This document serves as a record of services personally performed by Thea Silversmith , MD. It was created on her behalf by Lenn Cal, a trained medical scribe. The creation of this record is based on the scribe's personal observations and the provider's statements to them. This document has been checked and approved by the attending provider.   _______________________________________  Thea Silversmith, MD

## 2015-05-12 NOTE — Progress Notes (Signed)
Ms. Haskin here for reassessment s/p XRT to her right breast.  She reports achy/tender sensation to her right lateral ribs since the end of treatment.

## 2015-05-19 ENCOUNTER — Other Ambulatory Visit: Payer: Self-pay | Admitting: Radiation Oncology

## 2015-05-19 DIAGNOSIS — C50411 Malignant neoplasm of upper-outer quadrant of right female breast: Secondary | ICD-10-CM

## 2015-06-07 ENCOUNTER — Other Ambulatory Visit: Payer: Self-pay

## 2015-06-07 DIAGNOSIS — D0510 Intraductal carcinoma in situ of unspecified breast: Secondary | ICD-10-CM

## 2015-06-07 DIAGNOSIS — C50411 Malignant neoplasm of upper-outer quadrant of right female breast: Secondary | ICD-10-CM

## 2015-06-08 ENCOUNTER — Other Ambulatory Visit (HOSPITAL_BASED_OUTPATIENT_CLINIC_OR_DEPARTMENT_OTHER): Payer: BLUE CROSS/BLUE SHIELD

## 2015-06-08 ENCOUNTER — Ambulatory Visit (HOSPITAL_BASED_OUTPATIENT_CLINIC_OR_DEPARTMENT_OTHER): Payer: BLUE CROSS/BLUE SHIELD | Admitting: Oncology

## 2015-06-08 ENCOUNTER — Telehealth: Payer: Self-pay | Admitting: Oncology

## 2015-06-08 VITALS — BP 114/51 | HR 84 | Temp 98.2°F | Resp 20 | Ht 69.0 in | Wt 164.2 lb

## 2015-06-08 DIAGNOSIS — C50411 Malignant neoplasm of upper-outer quadrant of right female breast: Secondary | ICD-10-CM

## 2015-06-08 DIAGNOSIS — D0510 Intraductal carcinoma in situ of unspecified breast: Secondary | ICD-10-CM

## 2015-06-08 LAB — COMPREHENSIVE METABOLIC PANEL (CC13)
ALBUMIN: 3.8 g/dL (ref 3.5–5.0)
ALK PHOS: 64 U/L (ref 40–150)
ALT: 22 U/L (ref 0–55)
AST: 23 U/L (ref 5–34)
Anion Gap: 6 mEq/L (ref 3–11)
BUN: 12.7 mg/dL (ref 7.0–26.0)
CALCIUM: 9.2 mg/dL (ref 8.4–10.4)
CO2: 29 mEq/L (ref 22–29)
Chloride: 107 mEq/L (ref 98–109)
Creatinine: 1 mg/dL (ref 0.6–1.1)
EGFR: 71 mL/min/{1.73_m2} — AB (ref >90)
Glucose: 112 mg/dl (ref 70–140)
POTASSIUM: 4 meq/L (ref 3.5–5.1)
SODIUM: 142 meq/L (ref 136–145)
Total Bilirubin: 0.48 mg/dL (ref 0.20–1.20)
Total Protein: 6.4 g/dL (ref 6.4–8.3)

## 2015-06-08 LAB — CBC WITH DIFFERENTIAL/PLATELET
BASO%: 0.9 % (ref 0.0–2.0)
BASOS ABS: 0 10*3/uL (ref 0.0–0.1)
EOS ABS: 0.1 10*3/uL (ref 0.0–0.5)
EOS%: 1.6 % (ref 0.0–7.0)
HEMATOCRIT: 34.7 % — AB (ref 34.8–46.6)
HEMOGLOBIN: 11.9 g/dL (ref 11.6–15.9)
LYMPH%: 26.7 % (ref 14.0–49.7)
MCH: 32.1 pg (ref 25.1–34.0)
MCHC: 34.2 g/dL (ref 31.5–36.0)
MCV: 93.9 fL (ref 79.5–101.0)
MONO#: 0.3 10*3/uL (ref 0.1–0.9)
MONO%: 8.1 % (ref 0.0–14.0)
NEUT#: 2.6 10*3/uL (ref 1.5–6.5)
NEUT%: 62.7 % (ref 38.4–76.8)
Platelets: 187 10*3/uL (ref 145–400)
RBC: 3.7 10*6/uL (ref 3.70–5.45)
RDW: 12.3 % (ref 11.2–14.5)
WBC: 4.1 10*3/uL (ref 3.9–10.3)
lymph#: 1.1 10*3/uL (ref 0.9–3.3)
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
## 2015-06-08 NOTE — Telephone Encounter (Signed)
Gave avs & appointments. °

## 2015-09-09 ENCOUNTER — Encounter: Payer: Self-pay | Admitting: Adult Health

## 2015-09-09 ENCOUNTER — Other Ambulatory Visit: Payer: Self-pay

## 2015-09-09 DIAGNOSIS — Z9011 Acquired absence of right breast and nipple: Secondary | ICD-10-CM

## 2015-09-09 DIAGNOSIS — Z1231 Encounter for screening mammogram for malignant neoplasm of breast: Secondary | ICD-10-CM

## 2015-09-09 NOTE — Progress Notes (Signed)
A birthday card was mailed to the patient today on behalf of the Survivorship Program at [facility].   Gretchen Dawson, NP Survivorship Program [facility] [phone]  

## 2015-10-06 ENCOUNTER — Ambulatory Visit
Admission: RE | Admit: 2015-10-06 | Discharge: 2015-10-06 | Disposition: A | Discharge status: Home / Self Care | Payer: BLUE CROSS/BLUE SHIELD | Source: Ambulatory Visit | Attending: General Surgery | Admitting: General Surgery

## 2015-10-06 ENCOUNTER — Other Ambulatory Visit: Payer: Self-pay | Admitting: General Surgery

## 2015-10-06 DIAGNOSIS — C50411 Malignant neoplasm of upper-outer quadrant of right female breast: Secondary | ICD-10-CM

## 2015-10-06 DIAGNOSIS — C50911 Malignant neoplasm of unspecified site of right female breast: Secondary | ICD-10-CM

## 2015-10-06 DIAGNOSIS — N6310 Unspecified lump in the right breast, unspecified quadrant: Secondary | ICD-10-CM

## 2015-10-07 ENCOUNTER — Other Ambulatory Visit: Payer: Self-pay

## 2015-10-12 ENCOUNTER — Other Ambulatory Visit: Payer: BLUE CROSS/BLUE SHIELD

## 2015-10-14 ENCOUNTER — Ambulatory Visit: Payer: BLUE CROSS/BLUE SHIELD | Attending: General Surgery | Admitting: Physical Therapy

## 2015-10-14 DIAGNOSIS — R6889 Other general symptoms and signs: Secondary | ICD-10-CM | POA: Insufficient documentation

## 2015-10-14 DIAGNOSIS — Z9189 Other specified personal risk factors, not elsewhere classified: Secondary | ICD-10-CM

## 2015-10-14 DIAGNOSIS — M25611 Stiffness of right shoulder, not elsewhere classified: Secondary | ICD-10-CM

## 2015-10-14 DIAGNOSIS — M25511 Pain in right shoulder: Secondary | ICD-10-CM

## 2015-10-14 NOTE — Therapy (Signed)
[place], [place], [address] Phone: [phone]   Fax:  [phone]  Physical Therapy Evaluation  Patient Details  Name: Loretta Sanchez MRN: [id number] Date of Birth: 21-Jun-1976 Referring Provider: Dr. Donne Hazel   Encounter Date: 10/14/2015      PT End of Session - 10/14/15 1646    Visit Number 1   Number of Visits 9   PT Start Time 1600   PT Stop Time 1645   PT Time Calculation (min) 45 min   Activity Tolerance Patient tolerated treatment well   Behavior During Therapy [facility] for tasks assessed/performed      Past Medical History  Diagnosis Date  . Chronic vaginitis   . Vulvar vestibulitis   . Migraines   . Cancer     right breast cancer  . Heart murmur     as an infant  . Family history of adverse reaction to anesthesia     mom has n/v and difficulty waking up  . PONV (postoperative nausea and vomiting)   . Vaginal delivery 2005, 2008  . Radiation 02/25/15-04/06/15    Right breast upper-outer breast    Past Surgical History  Procedure Laterality Date  . Wisdom tooth extraction    . Dilation and curettage of uterus    . Re-excision of breast cancer,superior margins Right 01/25/2015    Procedure: EXCISION OF SKIN AND SOFT TISSUE RIGHT BREAST WITH TISSUE EXPANSION;  Surgeon: Rolm Bookbinder, MD;  Location: [place];  Service: General;  Laterality: Right;  . Simple mastectomy with axillary sentinel node biopsy Right 12/21/2014    Procedure: RIGHT SKIN SPARING MASTECTOMY WITH RIGHT  AXILLARY SENTINEL LYMPH NODE BIOPSY;  Surgeon: Rolm Bookbinder, MD;  Location: [place];  Service: General;  Laterality: Right;  . Breast reconstruction with placement of tissue expander and flex hd (acellular hydrated dermis) Right 12/21/2014    Procedure: RIGHT  BREAST RECONSTRUCTION WITH PLACEMENT OF TISSUE EXPANDER AND  ACELLULAR DERMIS;  Surgeon: Irene Limbo, MD;  Location: [place];  Service: Plastics;   Laterality: Right;  . Dilatation & curettage/hysteroscopy with myosure N/A 04/29/2015    Procedure: DILATATION & CURETTAGE/HYSTEROSCOPY ;  Surgeon: Marylynn Pearson, MD;  Location: [facility];  Service: Gynecology;  Laterality: N/A;    There were no vitals filed for this visit.  Visit Diagnosis:  Stiffness of joint, shoulder region, right - Plan: PT plan of care cert/re-cert  Pain in joint involving shoulder region, right - Plan: PT plan of care cert/re-cert  At risk for lymphedema - Plan: PT plan of care cert/re-cert      Subjective Assessment - 10/14/15 1609    Subjective tightness in anterior shoulder since radiation. occasional pain in between the shoulder blades    Pertinent History right mastectomy with 7 nodes removed with immediate expander placement with another surgery in May.  No chemo. Radiation compelted July 28.  Plans to have implant placed November 23 2015   Patient Stated Goals to get stronger before the next surgery    Currently in Pain? Yes   Pain Score 2    Pain Location Chest   Pain Orientation Right;Lateral   Pain Onset More than a month ago   Pain Frequency Intermittent   Aggravating Factors  unknown.    Pain Relieving Factors ibuprofen             [facility] PT Assessment - 10/14/15 0001    Assessment   Medical Diagnosis breast    Referring Provider  Dr. Donne Hazel    Onset Date/Surgical Date 12/21/14   Hand Dominance Right   Precautions   Precautions Other (comment)   Precaution Comments previous cancer and radiation    Restrictions   Weight Bearing Restrictions No   Balance Screen   Has the patient fallen in the past 6 months No   Has the patient had a decrease in activity level because of a fear of falling?  No   Is the patient reluctant to leave their home because of a fear of falling?  No   Home Ecologist residence   Living Arrangements Spouse/significant other;Children  children 8 and 10   Type of [place] One level   Prior Function   Level of Independence Independent   Leisure  walks twice a week and shoulder stretches almost daily    Cognition   Overall Cognitive Status Within Functional Limits for tasks assessed   Observation/Other Assessments   Observations Pt has indentation in skin at superior border of expander with visible tightness in axilla and anterior chest  fascial pulling visible in right medical upper arm when she raises arm out to side, no c/o pain or cording palpable    Skin Integrity intact    Sensation   Light Touch Not tested   Coordination   Gross Motor Movements are Fluid and Coordinated Yes   Posture/Postural Control   Posture/Postural Control Postural limitations   Postural Limitations Rounded Shoulders;Forward head   AROM   Right Shoulder Extension 55 Degrees   Right Shoulder Flexion 154 Degrees   Right Shoulder ABduction 169 Degrees   Right Shoulder External Rotation 80 Degrees   Left Shoulder Extension 65 Degrees   Left Shoulder Flexion 180 Degrees   Left Shoulder ABduction 180 Degrees   Left Shoulder External Rotation 90 Degrees   Strength   Overall Strength Comments flatnes in between scapula indicating decrased scapular strength   Right Shoulder Flexion 3/5   Right Shoulder ABduction 3/5   Left Shoulder Flexion 4/5   Left Shoulder ABduction 4/5   Palpation   Palpation comment tender tightness at pec major muscle  going toward right shoulder            LYMPHEDEMA/ONCOLOGY QUESTIONNAIRE - 10/14/15 1621    Right Upper Extremity Lymphedema   10 cm Proximal to Olecranon Process 28 cm   Olecranon Process 26 cm   15 cm Proximal to Ulnar Styloid Process 25.5 cm   10 cm Proximal to Ulnar Styloid Process 22.6 cm   Just Proximal to Ulnar Styloid Process 16 cm   Across Hand at [employer] 20.7 cm   At [place] of 2nd Digit 6.3 cm   Left Upper Extremity Lymphedema   10 cm Proximal to Olecranon Process 27 cm   Olecranon Process 24.5 cm   15 cm  Proximal to Ulnar Styloid Process 24 cm   10 cm Proximal to Ulnar Styloid Process 21 cm   Just Proximal to Ulnar Styloid Process 15.5 cm   Across Hand at [employer] 19.5 cm   At [place] of 2nd Digit 6 cm           Quick Dash - 10/14/15 0001    Open a tight or new jar Mild difficulty   Do heavy household chores (wash walls, wash floors) No difficulty   Carry a shopping bag or briefcase No difficulty   Wash your back Mild difficulty   Use a  knife to cut food No difficulty   Recreational activities in which you take some force or impact through your arm, shoulder, or hand (golf, hammering, tennis) No difficulty   During the past week, to what extent has your arm, shoulder or hand problem interfered with your normal social activities with family, friends, neighbors, or groups? Not at all   During the past week, to what extent has your arm, shoulder or hand problem limited your work or other regular daily activities Slightly   Arm, shoulder, or hand pain. Moderate   Tingling (pins and needles) in your arm, shoulder, or hand Moderate   Difficulty Sleeping Mild difficulty   DASH Score 18.18 %                     PT Education - 10/14/15 1646    Education provided Yes   Education Details supine scapular stretches and strengthening    Person(s) Educated Patient   Methods Explanation;Demonstration;Handout   Comprehension Verbalized understanding;Returned demonstration                [facility] Goals - 10/14/15 1704    CC Long Term Goal  #1   Title right shoulder active flexion to at least 170 degrees for improved overhead reach   Baseline 154   Time 0   Period Weeks   Status New   CC Long Term Goal  #2   Title Decrease in Quick DASH score to < 10 indicating an improvment in shoulder function   Baseline 18.18   Time 4   Period Weeks   Status New   CC Long Term Goal  #3   Title Report of 75% decrease inshoulder pain and tightness so that she can  perform activities with her kids safely    Time 4   Period Weeks   Status New   CC Long Term Goal  #4   Title independent with HEP for right shoulder ROM and strengthening   Time 4   Period Weeks   Status New            Plan - 10/14/15 1649    Clinical Impression Statement Loretta Sanchez has had right mastectomy with 7 nodes removed expnander placement and radiation. She is preparing for implant surgery and continues to have tightness with myofasclal restriction , pain and  decreased range of motion of right shoulder. Her right arm circumferences are increased compared to left, but that may be because she is very right handed she does not report subjective symtoms of swelling, but she is at risk to develop lymphedema      Pt will benefit from skilled therapeutic intervention in order to improve on the following deficits Increased fascial restricitons;Pain;Postural dysfunction;Decreased range of motion   Rehab Potential Excellent   Clinical Impairments Affecting Rehab Potential previous radiatoin, expander placement    PT Frequency 2x / week   PT Duration 4 weeks   PT Treatment/Interventions Passive range of motion;Manual techniques;Therapeutic activities;Patient/family education;Therapeutic exercise;Taping;Compression bandaging   PT Next Visit Plan manual techniques for range of motion and stretching right shoulder. Progress scapular strengthening with resisted retraction and [place] , consider closed chain and resisted scapular  protraction strengthening.    Consulted and Agree with Plan of Care Patient         Problem List Patient Active Problem List   Diagnosis Date Noted  . DCIS (ductal carcinoma in situ) of breast 12/21/2014  . Family history of malignant neoplasm of  gastrointestinal tract 11/26/2014  . 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 ([place]) 11/23/2014  . Migraines 11/23/2014   Donato Heinz. Owens Shark, PT  10/14/2015, 5:09 PM  [place], [place], [address] Phone: [phone]   Fax:  [phone]  Name: Loretta Sanchez MRN: [id number] Date of Birth: 02-25-1976

## 2015-10-14 NOTE — Patient Instructions (Signed)
Over Head Pull: Narrow Grip        On back, knees bent, feet flat, band across thighs, elbows straight but relaxed. Pull hands apart (start). Keeping elbows straight, bring arms up and over head, hands toward floor. Keep pull steady on band. Hold momentarily. Return slowly, keeping pull steady, back to start. Repeat 5-10___ times. Band color __red____   Side Pull: Double Arm   On back, knees bent, feet flat. Arms perpendicular to body, shoulder level, elbows straight but relaxed. Pull arms out to sides, elbows straight. Resistance band comes across collarbones, hands toward floor. Hold momentarily. Slowly return to starting position. Repeat _5-10__ times. Band color __red___   Sash   On back, knees bent, feet flat, left hand on left hip, right hand above left. Pull right arm DIAGONALLY (hip to shoulder) across chest. Bring right arm along head toward floor. Hold momentarily. Slowly return to starting position. Repeat _5-10__ times. Do with left arm. Band color _red_____   Shoulder Rotation: Double Arm   On back, knees bent, feet flat, elbows tucked at sides, bent 90, hands palms up. Pull hands apart and down toward floor, keeping elbows near sides. Hold momentarily. Slowly return to starting position. Repeat _5-10__ times. Band color ___red___   

## 2015-10-20 ENCOUNTER — Ambulatory Visit: Payer: BLUE CROSS/BLUE SHIELD | Admitting: Physical Therapy

## 2015-10-20 DIAGNOSIS — Z9189 Other specified personal risk factors, not elsewhere classified: Secondary | ICD-10-CM

## 2015-10-20 DIAGNOSIS — R6889 Other general symptoms and signs: Secondary | ICD-10-CM

## 2015-10-20 DIAGNOSIS — M25611 Stiffness of right shoulder, not elsewhere classified: Secondary | ICD-10-CM

## 2015-10-20 DIAGNOSIS — M25511 Pain in right shoulder: Secondary | ICD-10-CM

## 2015-10-20 NOTE — Patient Instructions (Signed)
Strengthening: Resisted Internal Rotation   Hold tubing in left hand, elbow at side and forearm out. Rotate forearm in across body. Repeat _5-10___ times per set. Do _2-3___ sets per session.  http://orth.exer.us/830   Copyright  VHI. All rights reserved.  Strengthening: Resisted External Rotation   Hold tubing in right hand, elbow at side and forearm across body. Rotate forearm out. Repeat ____ times per set. Do ____ sets per session. Do ____ sessions per day.  http://orth.exer.us/828   Copyright  VHI. All rights reserved.  Strengthening: Resisted Flexion   Hold tubing with left arm at side. Pull forward and up. Move shoulder through pain-free range of motion. Repeat _5-10___ times per set. Do __5-10__ sets per session.   http://orth.exer.us/824   Copyright  VHI. All rights reserved.  Strengthening: Resisted Extension   Hold tubing in right hand, arm forward. Pull arm back, elbow straight. Repeat _5-1-___ times per set. Do __2-3__ sets per session.  http://orth.exer.us/832   Copyright  VHI. All rights reserved.

## 2015-10-20 NOTE — Therapy (Signed)
[place], [place], [address] Phone: [phone]   Fax:  [phone]  Physical Therapy Treatment  Patient Details  Name: Loretta Sanchez MRN: [id number] Date of Birth: [birth year] Referring Provider: Dr. Donne Hazel   Encounter Date: 10/20/2015      PT End of Session - 10/20/15 1228    Visit Number 2   Number of Visits 9   PT Start Time 0930   PT Stop Time 1015   PT Time Calculation (min) 45 min   Activity Tolerance Patient tolerated treatment well   Behavior During Therapy [facility] for tasks assessed/performed      Past Medical History  Diagnosis Date  . Chronic vaginitis   . Vulvar vestibulitis   . Migraines   . Cancer     right breast cancer  . Heart murmur     as an infant  . Family history of adverse reaction to anesthesia     mom has n/v and difficulty waking up  . PONV (postoperative nausea and vomiting)   . Vaginal delivery 2005, 2008  . Radiation 02/25/15-04/06/15    Right breast upper-outer breast    Past Surgical History  Procedure Laterality Date  . Wisdom tooth extraction    . Dilation and curettage of uterus    . Re-excision of breast cancer,superior margins Right 01/25/2015    Procedure: EXCISION OF SKIN AND SOFT TISSUE RIGHT BREAST WITH TISSUE EXPANSION;  Surgeon: Rolm Bookbinder, MD;  Location: [place];  Service: General;  Laterality: Right;  . Simple mastectomy with axillary sentinel node biopsy Right 12/21/2014    Procedure: RIGHT SKIN SPARING MASTECTOMY WITH RIGHT  AXILLARY SENTINEL LYMPH NODE BIOPSY;  Surgeon: Rolm Bookbinder, MD;  Location: [place];  Service: General;  Laterality: Right;  . Breast reconstruction with placement of tissue expander and flex hd (acellular hydrated dermis) Right 12/21/2014    Procedure: RIGHT  BREAST RECONSTRUCTION WITH PLACEMENT OF TISSUE EXPANDER AND  ACELLULAR DERMIS;  Surgeon: Irene Limbo, MD;  Location: [place];  Service: Plastics;  Laterality:  Right;  . Dilatation & curettage/hysteroscopy with myosure N/A 04/29/2015    Procedure: DILATATION & CURETTAGE/HYSTEROSCOPY ;  Surgeon: Marylynn Pearson, MD;  Location: [facility];  Service: Gynecology;  Laterality: N/A;    There were no vitals filed for this visit.  Visit Diagnosis:  Stiffness of joint, shoulder region, right  Pain in joint involving shoulder region, right  At risk for lymphedema  Impaired function of upper extremity      Subjective Assessment - 10/20/15 0937    Subjective Pt reports she has been doing her exercises with red theraband 10 reps every morning.  Occaionally numbess in whole are "like her arm  is asleep"    Pertinent History right mastectomy with 7 nodes removed with immediate expander placement with another surgery in May.  No chemo. Radiation compelted July 28.  Plans to have implant placed November 23 2015   Patient Stated Goals to get stronger before the next surgery    Currently in Pain? No/denies                         [facility] Adult PT Treatment/Exercise - 10/20/15 0001    Shoulder Exercises: Supine   Protraction Strengthening;Right;10 reps;Weights  3 sets of 10, but pt with some numbness in right forearm   Shoulder Exercises: Standing   Other Standing Exercises scapular retraction with red theraband and elvation with 4 pound weight  with mirror for  visual cues. 2 sets of 10    Other Standing Exercises [place] series x 5 reps with green theraband    Shoulder Exercises: ROM/Strengthening   Over Head Lace in sitting with 5 reps overhead in each direction    Ball on Wall 5 reps of stretching on yellow ball    Manual Therapy   Myofascial Release to right lateral chest    Scapular Mobilization in left sidelying to riight scapula in all directions    Passive ROM to right arm in diagonal and conventional planes                         [facility] Goals - 10/14/15 1704    CC Long Term Goal  #1   Title right shoulder  active flexion to at least 170 degrees for improved overhead reach   Baseline 154   Time 0   Period Weeks   Status New   CC Long Term Goal  #2   Title Decrease in Quick DASH score to < 10 indicating an improvment in shoulder function   Baseline 18.18   Time 4   Period Weeks   Status New   CC Long Term Goal  #3   Title Report of 75% decrease inshoulder pain and tightness so that she can perform activities with her kids safely    Time 4   Period Weeks   Status New   CC Long Term Goal  #4   Title independent with HEP for right shoulder ROM and strengthening   Time 4   Period Weeks   Status New            Plan - 10/20/15 1234    Clinical Impression Statement Used mirrors for visual feedback for pt so that she could see periscapular muscle atrophy and assymetry of movement .  She was able to correct with visual cues demonstrating good body awareness.  She had some increased  temporary numbness of right forearm with repeated weighted supine scapular protration indicating functional weakness. She will need to progress steadily and cautiously with scapular strengthening    Clinical Impairments Affecting Rehab Potential previous radiatoin, expander placement    PT Next Visit Plan manual techniques for range of motion and stretching right shoulder. Progress scapular strengthening with resisted retraction and resisted wall walking series and counterop push ups  , consider closed chain and resisted scapular  protraction strengthening.    Consulted and Agree with Plan of Care Patient        Problem List Patient Active Problem List   Diagnosis Date Noted  . DCIS (ductal carcinoma in situ) of breast 12/21/2014  . Family history of malignant neoplasm of gastrointestinal tract 11/26/2014  . 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 Migraines 11/23/2014   Donato Heinz. Owens Shark, PT    10/20/2015, 12:39 PM  [place], [place], [address] Phone: [phone]   Fax:  [phone]  Name: LUANE LITTIG MRN: [id number] Date of Birth: 10-Jun-1976

## 2015-10-22 ENCOUNTER — Ambulatory Visit: Payer: BLUE CROSS/BLUE SHIELD | Admitting: Physical Therapy

## 2015-10-22 DIAGNOSIS — M25611 Stiffness of right shoulder, not elsewhere classified: Secondary | ICD-10-CM | POA: Diagnosis not present

## 2015-10-22 DIAGNOSIS — M25511 Pain in right shoulder: Secondary | ICD-10-CM

## 2015-10-22 DIAGNOSIS — Z9189 Other specified personal risk factors, not elsewhere classified: Secondary | ICD-10-CM

## 2015-10-22 DIAGNOSIS — R6889 Other general symptoms and signs: Secondary | ICD-10-CM

## 2015-10-22 NOTE — Therapy (Signed)
[place], [place], [address] Phone: [phone]   Fax:  [phone]  Physical Therapy Treatment  Patient Details  Name: Loretta Sanchez MRN: [id number] Date of Birth: 1976-02-05 Referring Provider: Dr. Donne Hazel   Encounter Date: 10/22/2015    Past Medical History  Diagnosis Date  . Chronic vaginitis   . Vulvar vestibulitis   . Migraines   . Cancer     right breast cancer  . Heart murmur     as an infant  . Family history of adverse reaction to anesthesia     mom has n/v and difficulty waking up  . PONV (postoperative nausea and vomiting)   . Vaginal delivery 2005, 2008  . Radiation 02/25/15-04/06/15    Right breast upper-outer breast    Past Surgical History  Procedure Laterality Date  . Wisdom tooth extraction    . Dilation and curettage of uterus    . Re-excision of breast cancer,superior margins Right 01/25/2015    Procedure: EXCISION OF SKIN AND SOFT TISSUE RIGHT BREAST WITH TISSUE EXPANSION;  Surgeon: Rolm Bookbinder, MD;  Location: [place];  Service: General;  Laterality: Right;  . Simple mastectomy with axillary sentinel node biopsy Right 12/21/2014    Procedure: RIGHT SKIN SPARING MASTECTOMY WITH RIGHT  AXILLARY SENTINEL LYMPH NODE BIOPSY;  Surgeon: Rolm Bookbinder, MD;  Location: [place];  Service: General;  Laterality: Right;  . Breast reconstruction with placement of tissue expander and flex hd (acellular hydrated dermis) Right 12/21/2014    Procedure: RIGHT  BREAST RECONSTRUCTION WITH PLACEMENT OF TISSUE EXPANDER AND  ACELLULAR DERMIS;  Surgeon: Irene Limbo, MD;  Location: [place];  Service: Plastics;  Laterality: Right;  . Dilatation & curettage/hysteroscopy with myosure N/A 04/29/2015    Procedure: DILATATION & CURETTAGE/HYSTEROSCOPY ;  Surgeon: Marylynn Pearson, MD;  Location: [facility];  Service: Gynecology;  Laterality: N/A;    There were no vitals filed for this visit.  Visit  Diagnosis:  Stiffness of joint, shoulder region, right  Pain in joint involving shoulder region, right  At risk for lymphedema  Impaired function of upper extremity      Subjective Assessment - 10/22/15 0817    Subjective some tingling in right arm the day after last session, but not yesterday.     Pertinent History right mastectomy with 7 nodes removed with immediate expander placement with another surgery in May.  No chemo. Radiation compelted July 28.  Plans to have implant placed November 23 2015   Patient Stated Goals to get stronger before the next surgery    Currently in Pain? No/denies                         [facility] Adult PT Treatment/Exercise - 10/22/15 0001    Lumbar Exercises: Standing   Other Standing Lumbar Exercises squats with wall tap x 15 reps for posterior core work    Lumbar Exercises: Supine   Other Supine Lumbar Exercises legs in tabletop 1 reps of each leg extension x 2 sets    for lower core stablility    Shoulder Exercises: Supine   Other Supine Exercises upper thoracic stretch over a yellow ball    Shoulder Exercises: Standing   Retraction Strengthening;Both;10 reps;Theraband   Theraband Level (Shoulder Retraction) Level 2 (Red)   Retraction Limitations verbal cues to keep elbows at sides and keep shoulders down    Other Standing Exercises shoulder elevation with 4 pound weight x 2  sets of 10 pt with some fatigue    Shoulder Exercises: ROM/Strengthening   Over Head Lace in standing    Plank Other (comment)   Plank Limitations with hands on armrests of charir 30 reps of leg extensions and 2 sets of 10 with spiderman leg extensions    Other ROM/Strengthening Exercises leaning forward on yellow ball on countertop.  10 sets of elbow retractions, 10 reps of horizontal abduction to even with body.  10 reps of abduction and external roation with elbows bents.    Other ROM/Strengthening Exercises free motion for bilateral arm extension also did 2 sets  of 10 pulses at hyperextension.                         [facility] Goals - 10/14/15 1704    CC Long Term Goal  #1   Title right shoulder active flexion to at least 170 degrees for improved overhead reach   Baseline 154   Time 0   Period Weeks   Status New   CC Long Term Goal  #2   Title Decrease in Quick DASH score to < 10 indicating an improvment in shoulder function   Baseline 18.18   Time 4   Period Weeks   Status New   CC Long Term Goal  #3   Title Report of 75% decrease inshoulder pain and tightness so that she can perform activities with her kids safely    Time 4   Period Weeks   Status New   CC Long Term Goal  #4   Title independent with HEP for right shoulder ROM and strengthening   Time 4   Period Weeks   Status New            Plan - 10/22/15 0941    Clinical Impression Statement treatment today focused on upper back strengthening and stretching    Clinical Impairments Affecting Rehab Potential previous radiatoin, expander placement    PT Next Visit Plan manual techniques for range of motion and stretching right shoulder. Progress scapular strengthening with resisted retraction and resisted wall walking series and counterop push ups with cures to keep chest open , continue  closed chain and resisted scapular  protraction strengthening.    Consulted and Agree with Plan of Care Patient        Problem List Patient Active Problem List   Diagnosis Date Noted  . DCIS (ductal carcinoma in situ) of breast 12/21/2014  . Family history of malignant neoplasm of gastrointestinal tract 11/26/2014  . 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 Migraines 11/23/2014   Donato Heinz. Owens Shark, PT   10/22/2015, 9:46 AM  [place], [place], [address] Phone: [phone]   Fax:  [phone]  Name: Loretta Sanchez MRN: [id number] Date of  Birth: 18-Apr-1976

## 2015-10-26 ENCOUNTER — Ambulatory Visit: Payer: BLUE CROSS/BLUE SHIELD | Admitting: Physical Therapy

## 2015-10-26 DIAGNOSIS — Z9189 Other specified personal risk factors, not elsewhere classified: Secondary | ICD-10-CM

## 2015-10-26 DIAGNOSIS — R6889 Other general symptoms and signs: Secondary | ICD-10-CM

## 2015-10-26 DIAGNOSIS — M25511 Pain in right shoulder: Secondary | ICD-10-CM

## 2015-10-26 DIAGNOSIS — M25611 Stiffness of right shoulder, not elsewhere classified: Secondary | ICD-10-CM

## 2015-10-26 NOTE — Therapy (Signed)
[place], [place], [address] Phone: [phone]   Fax:  [phone]  Physical Therapy Treatment  Patient Details  Name: Loretta Sanchez MRN: [id number] Date of Birth: Nov 16, 1975 Referring Provider: Dr. Donne Hazel   Encounter Date: 10/26/2015      PT End of Session - 10/26/15 1433    Visit Number 4   Number of Visits 9   PT Start Time [id number]   PT Stop Time 1515   PT Time Calculation (min) 42 min   Activity Tolerance Patient tolerated treatment well   Behavior During Therapy [facility] for tasks assessed/performed      Past Medical History  Diagnosis Date  . Chronic vaginitis   . Vulvar vestibulitis   . Migraines   . Cancer     right breast cancer  . Heart murmur     as an infant  . Family history of adverse reaction to anesthesia     mom has n/v and difficulty waking up  . PONV (postoperative nausea and vomiting)   . Vaginal delivery 2005, 2008  . Radiation 02/25/15-04/06/15    Right breast upper-outer breast    Past Surgical History  Procedure Laterality Date  . Wisdom tooth extraction    . Dilation and curettage of uterus    . Re-excision of breast cancer,superior margins Right 01/25/2015    Procedure: EXCISION OF SKIN AND SOFT TISSUE RIGHT BREAST WITH TISSUE EXPANSION;  Surgeon: Rolm Bookbinder, MD;  Location: [place];  Service: General;  Laterality: Right;  . Simple mastectomy with axillary sentinel node biopsy Right 12/21/2014    Procedure: RIGHT SKIN SPARING MASTECTOMY WITH RIGHT  AXILLARY SENTINEL LYMPH NODE BIOPSY;  Surgeon: Rolm Bookbinder, MD;  Location: [place];  Service: General;  Laterality: Right;  . Breast reconstruction with placement of tissue expander and flex hd (acellular hydrated dermis) Right 12/21/2014    Procedure: RIGHT  BREAST RECONSTRUCTION WITH PLACEMENT OF TISSUE EXPANDER AND  ACELLULAR DERMIS;  Surgeon: Irene Limbo, MD;  Location: [place];  Service: Plastics;   Laterality: Right;  . Dilatation & curettage/hysteroscopy with myosure N/A 04/29/2015    Procedure: DILATATION & CURETTAGE/HYSTEROSCOPY ;  Surgeon: Marylynn Pearson, MD;  Location: [facility];  Service: Gynecology;  Laterality: N/A;    There were no vitals filed for this visit.  Visit Diagnosis:  Stiffness of joint, shoulder region, right  Pain in joint involving shoulder region, right  At risk for lymphedema  Impaired function of upper extremity      Subjective Assessment - 10/26/15 1437    Subjective intermittent tingling in elbow and wrist, not all the time, but it seems to be more frquent.    Pertinent History right mastectomy with 7 nodes removed with immediate expander placement with another surgery in May.  No chemo. Radiation compelted July 28.  Plans to have implant placed November 23 2015   Patient Stated Goals to get stronger before the next surgery    Currently in Pain? Yes   Pain Score 2    Pain Orientation Right   Pain Descriptors / Indicators Tingling;Aching   Pain Radiating Towards down toward elbow and wrist    Pain Onset More than a month ago   Pain Frequency Intermittent               LYMPHEDEMA/ONCOLOGY QUESTIONNAIRE - 10/26/15 1453    Right Upper Extremity Lymphedema   10 cm Proximal to Olecranon Process 27.5 cm   Olecranon Process 25.6 cm  15 cm Proximal to Ulnar Styloid Process 25.4 cm   10 cm Proximal to Ulnar Styloid Process 22.3 cm   Just Proximal to Ulnar Styloid Process 15.5 cm   Across Hand at [employer] 19.5 cm   At [place] of 2nd Digit 6 cm                  [facility] Adult PT Treatment/Exercise - 10/26/15 0001    Self-Care   Self-Care Other Self-Care Comments   Other Self-Care Comments  provided small tg soft to see if pt could be symptomatic relief from tingling    Shoulder Exercises: Standing   Retraction Strengthening;Both;10 reps;Theraband  3 sets of 10   Theraband Level (Shoulder Retraction) Level 2 (Red)   Shoulder  Exercises: Pulleys   Flexion 3 minutes   Flexion Limitations verbal cues for techniques    ABduction 3 minutes   Shoulder Exercises: ROM/Strengthening   Wall Pushups 10 reps  on yellow ball    Ball on Wall 10 with yellow ball    Shoulder Exercises: Stretch   Corner Stretch 3 reps;10 seconds   Manual Therapy   Myofascial Release to right lateral scapular border    Scapular Mobilization in left sidelying to riight scapula in all directions    Passive ROM to right arm in diagonal and conventional planes                         Long Term Clinic Goals - 10/14/15 1704    CC Long Term Goal  #1   Title right shoulder active flexion to at least 170 degrees for improved overhead reach   Baseline 154   Time 0   Period Weeks   Status New   CC Long Term Goal  #2   Title Decrease in Quick DASH score to < 10 indicating an improvment in shoulder function   Baseline 18.18   Time 4   Period Weeks   Status New   CC Long Term Goal  #3   Title Report of 75% decrease inshoulder pain and tightness so that she can perform activities with her kids safely    Time 4   Period Weeks   Status New   CC Long Term Goal  #4   Title independent with HEP for right shoulder ROM and strengthening   Time 4   Period Weeks   Status New            Plan - 10/26/15 1706    Clinical Impression Statement Remeasured right arm circumference to see if tingling could be from early stage lymphedema, but pt actually had decrease in arm size since last measured at evaluation.  Provided small tg soft to see if it had any symptomatic effect. Pt continues to have tightness at lateral scapula muscles    Clinical Impairments Affecting Rehab Potential previous radiatoin, expander placement    PT Next Visit Plan Assess effect of tg soft. manual techniques for range of motion and stretching right shoulder. Progress scapular strengthening with resisted retraction and resisted wall walking series and counterop  push ups with cures to keep chest open , continue  closed chain and resisted scapular  protraction strengthening.    Consulted and Agree with Plan of Care Patient        Problem List Patient Active Problem List   Diagnosis Date Noted  . DCIS (ductal carcinoma in situ) of breast 12/21/2014  . Family history of malignant  neoplasm of gastrointestinal tract 11/26/2014  . 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 Migraines 11/23/2014   Donato Heinz. Owens Shark, PT  10/26/2015, 5:08 PM  [place], [place], [address] Phone: [phone]   Fax:  [phone]  Name: Loretta Sanchez MRN: [id number] Date of Birth: [birth year]

## 2015-10-28 ENCOUNTER — Ambulatory Visit: Payer: BLUE CROSS/BLUE SHIELD | Admitting: Physical Therapy

## 2015-10-28 DIAGNOSIS — Z9189 Other specified personal risk factors, not elsewhere classified: Secondary | ICD-10-CM

## 2015-10-28 DIAGNOSIS — M25511 Pain in right shoulder: Secondary | ICD-10-CM

## 2015-10-28 DIAGNOSIS — M25611 Stiffness of right shoulder, not elsewhere classified: Secondary | ICD-10-CM | POA: Diagnosis not present

## 2015-10-28 NOTE — Therapy (Signed)
[place], [place], [address] Phone: [phone]   Fax:  [phone]  Physical Therapy Treatment  Patient Details  Name: Loretta Sanchez MRN: [id number] Date of Birth: 09/25/1975 Referring Provider: Dr. Donne Hazel   Encounter Date: 10/28/2015      PT End of Session - 10/28/15 1657    Visit Number 5   Number of Visits 9   PT Start Time [id number]   PT Stop Time [id number]   PT Time Calculation (min) 41 min   Activity Tolerance Patient tolerated treatment well   Behavior During Therapy [facility] for tasks assessed/performed      Past Medical History  Diagnosis Date  . Chronic vaginitis   . Vulvar vestibulitis   . Migraines   . Cancer     right breast cancer  . Heart murmur     as an infant  . Family history of adverse reaction to anesthesia     mom has n/v and difficulty waking up  . PONV (postoperative nausea and vomiting)   . Vaginal delivery 2005, 2008  . Radiation 02/25/15-04/06/15    Right breast upper-outer breast    Past Surgical History  Procedure Laterality Date  . Wisdom tooth extraction    . Dilation and curettage of uterus    . Re-excision of breast cancer,superior margins Right 01/25/2015    Procedure: EXCISION OF SKIN AND SOFT TISSUE RIGHT BREAST WITH TISSUE EXPANSION;  Surgeon: Rolm Bookbinder, MD;  Location: [place];  Service: General;  Laterality: Right;  . Simple mastectomy with axillary sentinel node biopsy Right 12/21/2014    Procedure: RIGHT SKIN SPARING MASTECTOMY WITH RIGHT  AXILLARY SENTINEL LYMPH NODE BIOPSY;  Surgeon: Rolm Bookbinder, MD;  Location: [place];  Service: General;  Laterality: Right;  . Breast reconstruction with placement of tissue expander and flex hd (acellular hydrated dermis) Right 12/21/2014    Procedure: RIGHT  BREAST RECONSTRUCTION WITH PLACEMENT OF TISSUE EXPANDER AND  ACELLULAR DERMIS;  Surgeon: Irene Limbo, MD;  Location: [place];  Service: Plastics;   Laterality: Right;  . Dilatation & curettage/hysteroscopy with myosure N/A 04/29/2015    Procedure: DILATATION & CURETTAGE/HYSTEROSCOPY ;  Surgeon: Marylynn Pearson, MD;  Location: [facility];  Service: Gynecology;  Laterality: N/A;    There were no vitals filed for this visit.  Visit Diagnosis:  Stiffness of joint, shoulder region, right  Pain in joint involving shoulder region, right  At risk for lymphedema      Subjective Assessment - 10/28/15 1606    Subjective still having the tingling in her arm.  She feels is not able to identify the trigger.    Pertinent History right mastectomy with 7 nodes removed with immediate expander placement with another surgery in May.  No chemo. Radiation compelted July 28.  Plans to have implant placed November 23 2015   Patient Stated Goals to get stronger before the next surgery    Currently in Pain? Yes   Pain Score 2    Pain Orientation Right   Pain Descriptors / Indicators Tingling;Aching                         [facility] Adult PT Treatment/Exercise - 10/28/15 0001    Shoulder Exercises: Standing   Retraction Strengthening;Both;10 reps;Theraband   Theraband Level (Shoulder Retraction) Level 2 (Red)   Other Standing Exercises yellow theraband loop for wall walking, elbows back and forearm slides 5 reps each    Manual  Therapy   Soft tissue mobilization in left sidelying, with right arm overhead, attempted insrument assisted soft tissue mobiliztation, but pt did not tolerate, then, used Biotone cream and soft tissue work to right proximal lateral arm and right lateral upper chest with some palpable softening of tissues. pt did not tolerate deep trigger point pressure as well either.    Myofascial Release right UE pulling in supine    Passive ROM to right arm in diagonal and conventional planes                         Long Term Clinic Goals - 10/14/15 1704    CC Long Term Goal  #1   Title right shoulder active flexion to  at least 170 degrees for improved overhead reach   Baseline 154   Time 0   Period Weeks   Status New   CC Long Term Goal  #2   Title Decrease in Quick DASH score to < 10 indicating an improvment in shoulder function   Baseline 18.18   Time 4   Period Weeks   Status New   CC Long Term Goal  #3   Title Report of 75% decrease inshoulder pain and tightness so that she can perform activities with her kids safely    Time 4   Period Weeks   Status New   CC Long Term Goal  #4   Title independent with HEP for right shoulder ROM and strengthening   Time 4   Period Weeks   Status New            Plan - 10/28/15 1657    Clinical Impression Statement pt continues to have symptoms in arm. tg soft did not really help  Added more scapular exercise with a light theraband and focused on soft tissue work today.  Pt does.not do well with deep pressure or instrument assisted techniques, but did well with soft tissue mobilization with biotone, reporting a decrase in symptoms after treatment    Pt will benefit from skilled therapeutic intervention in order to improve on the following deficits Increased fascial restricitons;Pain;Postural dysfunction;Decreased range of motion   Rehab Potential Excellent   Clinical Impairments Affecting Rehab Potential previous radiatoin, expander placement    PT Treatment/Interventions Passive range of motion;Manual techniques;Therapeutic activities;Patient/family education;Therapeutic exercise;Taping;Compression bandaging   PT Next Visit Plan  manual techniques for range of motion and stretching right shoulder. with soft tissue work with biotone to posterior axilla .  assess and  rogress scapular strengthening as tolerated   Consulted and Agree with Plan of Care Patient        Problem List Patient Active Problem List   Diagnosis Date Noted  . DCIS (ductal carcinoma in situ) of breast 12/21/2014  . Family history of malignant neoplasm of gastrointestinal tract  11/26/2014  . 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 Migraines 11/23/2014   Donato Heinz. Owens Shark, PT   10/28/2015, [place], [place], [address] Phone: [phone]   Fax:  [phone]  Name: TAMEYA SIRAVO MRN: [id number] Date of Birth: 1976-06-30

## 2015-11-02 ENCOUNTER — Ambulatory Visit: Payer: BLUE CROSS/BLUE SHIELD | Admitting: Physical Therapy

## 2015-11-02 DIAGNOSIS — M25611 Stiffness of right shoulder, not elsewhere classified: Secondary | ICD-10-CM

## 2015-11-02 DIAGNOSIS — M25511 Pain in right shoulder: Secondary | ICD-10-CM

## 2015-11-02 DIAGNOSIS — Z9189 Other specified personal risk factors, not elsewhere classified: Secondary | ICD-10-CM

## 2015-11-02 DIAGNOSIS — R6889 Other general symptoms and signs: Secondary | ICD-10-CM

## 2015-11-02 NOTE — Therapy (Signed)
[place], [place], [address] Phone: [phone]   Fax:  [phone]  Physical Therapy Treatment  Patient Details  Name: Loretta Sanchez MRN: [id number] Date of Birth: 1976-07-26 Referring Provider: Dr. Donne Hazel   Encounter Date: 11/02/2015      PT End of Session - 11/02/15 1209    Visit Number 6   Number of Visits 9   Date for PT Re-Evaluation 11/12/15   PT Start Time 1102   PT Stop Time 1146   PT Time Calculation (min) 44 min   Activity Tolerance Patient tolerated treatment well   Behavior During Therapy [facility] for tasks assessed/performed      Past Medical History  Diagnosis Date  . Chronic vaginitis   . Vulvar vestibulitis   . Migraines   . Cancer     right breast cancer  . Heart murmur     as an infant  . Family history of adverse reaction to anesthesia     mom has n/v and difficulty waking up  . PONV (postoperative nausea and vomiting)   . Vaginal delivery 2005, 2008  . Radiation 02/25/15-04/06/15    Right breast upper-outer breast    Past Surgical History  Procedure Laterality Date  . Wisdom tooth extraction    . Dilation and curettage of uterus    . Re-excision of breast cancer,superior margins Right 01/25/2015    Procedure: EXCISION OF SKIN AND SOFT TISSUE RIGHT BREAST WITH TISSUE EXPANSION;  Surgeon: Rolm Bookbinder, MD;  Location: [place];  Service: General;  Laterality: Right;  . Simple mastectomy with axillary sentinel node biopsy Right 12/21/2014    Procedure: RIGHT SKIN SPARING MASTECTOMY WITH RIGHT  AXILLARY SENTINEL LYMPH NODE BIOPSY;  Surgeon: Rolm Bookbinder, MD;  Location: [place];  Service: General;  Laterality: Right;  . Breast reconstruction with placement of tissue expander and flex hd (acellular hydrated dermis) Right 12/21/2014    Procedure: RIGHT  BREAST RECONSTRUCTION WITH PLACEMENT OF TISSUE EXPANDER AND  ACELLULAR DERMIS;  Surgeon: Irene Limbo, MD;  Location:  [place];  Service: Plastics;  Laterality: Right;  . Dilatation & curettage/hysteroscopy with myosure N/A 04/29/2015    Procedure: DILATATION & CURETTAGE/HYSTEROSCOPY ;  Surgeon: Marylynn Pearson, MD;  Location: [facility];  Service: Gynecology;  Laterality: N/A;    There were no vitals filed for this visit.  Visit Diagnosis:  Stiffness of joint, shoulder region, right  Pain in joint involving shoulder region, right  At risk for lymphedema  Impaired function of upper extremity      Subjective Assessment - 11/02/15 1105    Subjective Pt states she is feeling better.  Her arm has not been as numb.  She was able to run for 2 miles    Pertinent History right mastectomy with 7 nodes removed with immediate expander placement with another surgery in May.  No chemo. Radiation compelted July 28.  Plans to have implant placed November 23 2015   Patient Stated Goals to get stronger before the next surgery    Currently in Pain? No/denies            [facility] PT Assessment - 11/02/15 0001    AROM   Right Shoulder Flexion 165 Degrees   Right Shoulder ABduction 170 Degrees                     [facility] Adult PT Treatment/Exercise - 11/02/15 0001    Shoulder Exercises: Pulleys   Flexion 2 minutes  ABduction 2 minutes   Shoulder Exercises: ROM/Strengthening   Over Head Lace in standing    Modified Plank Limitations   Modified Plank Limitations on elbows and toes for 10 seconds with scpulae stabilzed    Other ROM/Strengthening Exercises countertop push ups and push ups with "a plus" for 5 reps    Other ROM/Strengthening Exercises in prone, for "I, T Y " exercise for scapular strengthening    Shoulder Exercises: IT sales professional 3 reps   Corner Stretch Limitations arms at "T" and "Y" position    Wall Stretch - Flexion 3 reps   Wall Stretch - Flexion Limitations unilateral and bilateral    Manual Therapy   Soft tissue mobilization in left sidelying ,with arm postitioned overhead and  at side. soft tissue work with Biotone to posterior and distal axilla especially at tender trigger area.  It seemed to lessen during treatment    Scapular Mobilization in left sidelying to right scapula in all directions                 PT Education - 11/02/15 1208    Education provided Yes   Education Details wall and corner stretch and prone scapular strengthening.    Person(s) Educated Patient   Methods Explanation;Demonstration;Handout   Comprehension Verbalized understanding;Returned demonstration                Long Term Clinic Goals - 11/02/15 1107    CC Long Term Goal  #1   Title right shoulder active flexion to at least 170 degrees for improved overhead reach   Baseline 154,  165 on 11/02/2015   Status On-going   CC Long Term Goal  #2   Title Decrease in Quick DASH score to < 10 indicating an improvment in shoulder function   Status On-going   CC Long Term Goal  #3   Title Report of 75% decrease inshoulder pain and tightness so that she can perform activities with her kids safely    Status Achieved   CC Long Term Goal  #4   Title independent with HEP for right shoulder ROM and strengthening   Status Achieved            Plan - 11/02/15 1211    Clinical Impression Statement pt reports feeling much better after soft tissue work with decrease in symptoms in arm and ability to do more at home.  She still has some tigthness in soft tissue under her arm at lateral scapular area.  Scapular strength is improving with increased symmetry in movement. She may be ready to discharge next visit if progress continues.    Clinical Impairments Affecting Rehab Potential previous radiatoin, expander placement    PT Next Visit Plan  manual techniques for range of motion and stretching right shoulder. with soft tissue work with biotone to posterior axilla  Remeasure and do Quick DASH if pt ready to discharge    Consulted and Agree with Plan of Care Patient         Problem List Patient Active Problem List   Diagnosis Date Noted  . DCIS (ductal carcinoma in situ) of breast 12/21/2014  . Family history of malignant neoplasm of gastrointestinal tract 11/26/2014  . 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 ([place]) 11/23/2014  . Migraines 11/23/2014   Donato Heinz. Owens Shark, PT   11/02/2015, 12:15 PM  [place], [place], [address] Phone: [phone]   Fax:  [phone]  Name: Loretta Sanchez MRN: [id number] Date of Birth: 1976-06-19

## 2015-11-04 ENCOUNTER — Ambulatory Visit: Payer: BLUE CROSS/BLUE SHIELD | Admitting: Physical Therapy

## 2015-11-04 DIAGNOSIS — M25611 Stiffness of right shoulder, not elsewhere classified: Secondary | ICD-10-CM

## 2015-11-04 DIAGNOSIS — Z9189 Other specified personal risk factors, not elsewhere classified: Secondary | ICD-10-CM

## 2015-11-04 DIAGNOSIS — M25511 Pain in right shoulder: Secondary | ICD-10-CM

## 2015-11-04 DIAGNOSIS — R6889 Other general symptoms and signs: Secondary | ICD-10-CM

## 2015-11-04 NOTE — Therapy (Signed)
[place], [place], [address] Phone: [phone]   Fax:  [phone]  Physical Therapy Treatment  Patient Details  Name: Loretta Sanchez MRN: [id number] Date of Birth: [birth year] Referring Provider: Dr. Donne Hazel   Encounter Date: 11/04/2015      PT End of Session - 11/04/15 1659    Visit Number 7   Number of Visits 9   Date for PT Re-Evaluation 11/12/15   PT Start Time 1600   PT Stop Time 1645   PT Time Calculation (min) 45 min   Activity Tolerance Patient tolerated treatment well   Behavior During Therapy [facility] for tasks assessed/performed      Past Medical History  Diagnosis Date  . Chronic vaginitis   . Vulvar vestibulitis   . Migraines   . Cancer     right breast cancer  . Heart murmur     as an infant  . Family history of adverse reaction to anesthesia     mom has n/v and difficulty waking up  . PONV (postoperative nausea and vomiting)   . Vaginal delivery 2005, 2008  . Radiation 02/25/15-04/06/15    Right breast upper-outer breast    Past Surgical History  Procedure Laterality Date  . Wisdom tooth extraction    . Dilation and curettage of uterus    . Re-excision of breast cancer,superior margins Right 01/25/2015    Procedure: EXCISION OF SKIN AND SOFT TISSUE RIGHT BREAST WITH TISSUE EXPANSION;  Surgeon: Rolm Bookbinder, MD;  Location: [place];  Service: General;  Laterality: Right;  . Simple mastectomy with axillary sentinel node biopsy Right 12/21/2014    Procedure: RIGHT SKIN SPARING MASTECTOMY WITH RIGHT  AXILLARY SENTINEL LYMPH NODE BIOPSY;  Surgeon: Rolm Bookbinder, MD;  Location: [place];  Service: General;  Laterality: Right;  . Breast reconstruction with placement of tissue expander and flex hd (acellular hydrated dermis) Right 12/21/2014    Procedure: RIGHT  BREAST RECONSTRUCTION WITH PLACEMENT OF TISSUE EXPANDER AND  ACELLULAR DERMIS;  Surgeon: Irene Limbo, MD;  Location:  [place];  Service: Plastics;  Laterality: Right;  . Dilatation & curettage/hysteroscopy with myosure N/A 04/29/2015    Procedure: DILATATION & CURETTAGE/HYSTEROSCOPY ;  Surgeon: Marylynn Pearson, MD;  Location: [facility];  Service: Gynecology;  Laterality: N/A;    There were no vitals filed for this visit.  Visit Diagnosis:  Stiffness of joint, shoulder region, right  Pain in joint involving shoulder region, right  At risk for lymphedema  Impaired function of upper extremity      Subjective Assessment - 11/04/15 1654    Subjective Pt is feeling good today she feels like she is ready to discharge after this treatment    Pertinent History right mastectomy with 7 nodes removed with immediate expander placement with another surgery in May.  No chemo. Radiation compelted July 28.  Plans to have implant placed November 23 2015   Patient Stated Goals to get stronger before the next surgery    Currently in Pain? No/denies                  Katina Dung - 11/04/15 0001    Open a tight or new jar No difficulty   Do heavy household chores (wash walls, wash floors) No difficulty   Carry a shopping bag or briefcase No difficulty   Wash your back No difficulty   Use a knife to cut food No difficulty   Recreational activities in which you take  some force or impact through your arm, shoulder, or hand (golf, hammering, tennis) Mild difficulty   During the past week, to what extent has your arm, shoulder or hand problem interfered with your normal social activities with family, friends, neighbors, or groups? Not at all   During the past week, to what extent has your arm, shoulder or hand problem limited your work or other regular daily activities Not at all   Arm, shoulder, or hand pain. Mild   Tingling (pins and needles) in your arm, shoulder, or hand Mild   Difficulty Sleeping No difficulty   DASH Score 6.82 %               [facility] Adult PT Treatment/Exercise - 11/04/15 0001    Neck  Exercises: Seated   Other Seated Exercise full cervical rotation with scapuae depressed    Other Seated Exercise sidebending stetch to opposite side tih hand on top of head and ipsilateral hand holding onto chair    Shoulder Exercises: Standing   Other Standing Exercises leaning forward over large all on countetop edge for scapular retraction and upper thoracic extension    Shoulder Exercises: Stretch   Other Shoulder Stretches insidelying and supine over foam roller for myofascial release and upper thoracic and anterior chest stretch    Manual Therapy   Soft tissue mobilization in left sidelying ,with arm postitioned overhead and at side. soft tissue work with Biotone to posterior and distal axilla especially at tender trigger area.  It seemed to lessen during treatment    Passive ROM to right arm in diagonal and conventional planes    Neck Exercises: Stretches   Levator Stretch 2 reps                        Long Term Clinic Goals - 11/04/15 1653    CC Long Term Goal  #1   Title right shoulder active flexion to at least 170 degrees for improved overhead reach   Baseline 154,  165 on 11/02/2015, 170 on 11/04/2015   Status Achieved   CC Long Term Goal  #2   Title Decrease in Quick DASH score to < 10 indicating an improvment in shoulder function   Baseline 18.18 at eval, 6.82 at 11/04/2015   Status Achieved   CC Long Term Goal  #3   Title Report of 75% decrease inshoulder pain and tightness so that she can perform activities with her kids safely    Status Achieved   CC Long Term Goal  #4   Title independent with HEP for right shoulder ROM and strengthening   Status Achieved            Plan - 11/04/15 1659    Clinical Impression Statement Pt feels that she is making progress toward her goal of getting stronger to prepare for the next surgery.  She has acheived all goals for this episode, but still has some mild pain and trigger point areas in right lateral border of  the scapula. She feels that she is ready to discharge andwill be able to continue her program at home    Clinical Impairments Affecting Rehab Potential previous radiatoin, expander placement    PT Next Visit Plan discharge this episode    Consulted and Agree with Plan of Care Patient        Problem List Patient Active Problem List   Diagnosis Date Noted  . DCIS (ductal carcinoma in situ) of breast 12/21/2014  .  Family history of malignant neoplasm of gastrointestinal tract 11/26/2014  . 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 Migraines 11/23/2014    PHYSICAL THERAPY DISCHARGE SUMMARY  Visits from Start of Care: 7  Current functional level related to goals / functional outcomes: Pt feels that she is getting stronger and ready for next surgery    Remaining deficits: Intermittent pain and tightness at right lateral scapular border    Education / Equipment: Home exercise  Plan: Patient agrees to discharge.  Patient goals were met. Patient is being discharged due to meeting the stated rehab goals.  ?????         Donato Heinz. Owens Shark, PT  11/04/2015, [place], [place], [address] Phone: [phone]   Fax:  [phone]  Name: Loretta Sanchez MRN: [id number] Date of Birth: 22-Oct-1975

## 2015-11-09 ENCOUNTER — Ambulatory Visit: Payer: BLUE CROSS/BLUE SHIELD | Admitting: Physical Therapy

## 2015-11-11 ENCOUNTER — Ambulatory Visit: Payer: BLUE CROSS/BLUE SHIELD | Admitting: Physical Therapy

## 2015-11-11 ENCOUNTER — Ambulatory Visit
Admission: RE | Admit: 2015-11-11 | Discharge: 2015-11-11 | Disposition: A | Discharge status: Home / Self Care | Payer: BLUE CROSS/BLUE SHIELD | Source: Ambulatory Visit

## 2015-11-11 DIAGNOSIS — Z9011 Acquired absence of right breast and nipple: Secondary | ICD-10-CM

## 2015-11-11 DIAGNOSIS — Z1231 Encounter for screening mammogram for malignant neoplasm of breast: Secondary | ICD-10-CM

## 2015-11-15 NOTE — H&P (Signed)
  Subjective:    Patient ID: Loretta Sanchez is a 40 y.o. female.  HPI  Nearly 11 months post op mastectomy, SLN, TE/ADM reconstruction. Patient required reexcision mastectomy flap for margins. Completed XRT 04/09/15. Plan implant exchange this month. Since last visit, has concern of right parasternal and right anterior axillary lines of firmness. [place] completed and punch biopsy parasternal lesion by Dr. Donne Hazel, benign.   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 Repeat excision with clear margins, no additional tumor. Repeat oncotype again with too little tissue and no plan for chemotherapy.  Presented with pain in the UOQ of right breast and had a brownish discharge from the right nipple. Underwent bilateral diagnostic MMG and right breast US. The breast density was category C. In the 9 o'clock area of the right breast there were numerous microcalcifications, extending over an area of 7 cm. There were calcifications in the retroareolar area as well in the right breast. US showed focally dilated ducts in the 9:00 position of the breast.U S of the right axilla was unremarkable as was the left breast. 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 MRI showed an area of confluent non-masslike enhancement throughout the outer right breast extending over 9 cm. Left breast unremarkable. Genetics panel negative.  Prior 38 B, happy with this. Mastectomy wt 834 g . Last MMG last week, no report available yet.   Review of Systems     Objective:   Physical Exam  Cardiovascular: Normal rate, regular rhythm and normal heart sounds.  Pulmonary/Chest: Effort normal and breath sounds normal.  Abdominal: Soft.  No hernias   Chest: Depression superior pole, and medial chest, axilla, hyperpigmentation post radiation resolved SN to nipple L 25 cm BW R 13 L 17  cm Nipple to IMF L 8 cm  Right anterior axillary chest with area of induration no mass    Assessment:     R breast cancer 8.0 cm with positive margins anterior, s/p re resection S/p TE/ADM reconstruction    Plan:     Plan second stage surgery for lipofilling, implant exchange and opposite breast mastopexy. Reviewed pain donor site, need for compression for 4-6 weeks, variable take graft, may need to repeat, risk donor site contour abnormalities or depression. Will treat chest wall and axilla reviewed reports of improved pain with fat grafting. Reviewed opposite breast lift with anchor or J type scars. No drain expected on this side.   Has elected for silicone, is comfortable to either round or shaped, textured or smooth. Reviewed my preference for textured if appropriate to prevent lateral displacement,  New pictures today. Will call her once MMG report available. With regards to right chest, reviewed her [place] which was felt to be similar in appearance to parasternal nodule which was scar tissue. Will not do any additional biopsies prior to surgery, counseled if any concern at time of surgery will send as biopsy. Reviewed risk of fat grafting can produce similar nodular areas, fat necrosis. 30 minutes spent with patient over half in counseling.  Plan observation stay as had problems with nausea. Also had nausea with pain medications, plan tramadol.   Mentor Artoura 375 ml tissue expander, Total fill volume 475 ml  Irene Limbo, MD [facility] [phone]

## 2015-11-16 ENCOUNTER — Encounter (HOSPITAL_BASED_OUTPATIENT_CLINIC_OR_DEPARTMENT_OTHER): Payer: Self-pay | Admitting: *Deleted

## 2015-11-23 ENCOUNTER — Ambulatory Visit (HOSPITAL_BASED_OUTPATIENT_CLINIC_OR_DEPARTMENT_OTHER)
Admission: RE | Admit: 2015-11-23 | Discharge: 2015-11-24 | Disposition: A | Discharge status: Home / Self Care | Payer: BLUE CROSS/BLUE SHIELD | Source: Ambulatory Visit | Attending: Plastic Surgery | Admitting: Plastic Surgery

## 2015-11-23 ENCOUNTER — Encounter (HOSPITAL_BASED_OUTPATIENT_CLINIC_OR_DEPARTMENT_OTHER)
Admission: RE | Disposition: A | Discharge status: Home / Self Care | Payer: Self-pay | Source: Ambulatory Visit | Attending: Plastic Surgery

## 2015-11-23 ENCOUNTER — Encounter (HOSPITAL_BASED_OUTPATIENT_CLINIC_OR_DEPARTMENT_OTHER): Payer: Self-pay

## 2015-11-23 ENCOUNTER — Ambulatory Visit (HOSPITAL_BASED_OUTPATIENT_CLINIC_OR_DEPARTMENT_OTHER): Payer: BLUE CROSS/BLUE SHIELD | Admitting: Anesthesiology

## 2015-11-23 DIAGNOSIS — Z923 Personal history of irradiation: Secondary | ICD-10-CM | POA: Diagnosis not present

## 2015-11-23 DIAGNOSIS — Z853 Personal history of malignant neoplasm of breast: Secondary | ICD-10-CM

## 2015-11-23 DIAGNOSIS — Z9011 Acquired absence of right breast and nipple: Secondary | ICD-10-CM | POA: Diagnosis not present

## 2015-11-23 DIAGNOSIS — Z79810 Long term (current) use of selective estrogen receptor modulators (SERMs): Secondary | ICD-10-CM | POA: Insufficient documentation

## 2015-11-23 DIAGNOSIS — N651 Disproportion of reconstructed breast: Secondary | ICD-10-CM | POA: Insufficient documentation

## 2015-11-23 HISTORY — PX: LIPOSUCTION WITH LIPOFILLING: SHX6436

## 2015-11-23 HISTORY — PX: MASTOPEXY: SHX5358

## 2015-11-23 HISTORY — PX: REMOVAL OF TISSUE EXPANDER AND PLACEMENT OF IMPLANT: SHX6457

## 2015-11-23 SURGERY — REMOVAL, TISSUE EXPANDER, BREAST, WITH IMPLANT INSERTION
Anesthesia: General | Site: Breast | Laterality: Right

## 2015-11-23 MED ORDER — KETOROLAC TROMETHAMINE 30 MG/ML IJ SOLN
INTRAMUSCULAR | Status: DC | PRN
  Administered 2015-11-23: 30 mg via INTRAVENOUS

## 2015-11-23 MED ORDER — ONDANSETRON HCL 4 MG/2ML IJ SOLN
4.0000 mg | Freq: Four times a day (QID) | INTRAMUSCULAR | Status: DC | PRN
  Administered 2015-11-23: 4 mg via INTRAVENOUS
  Filled 2015-11-23: qty 2

## 2015-11-23 MED ORDER — SULFAMETHOXAZOLE-TRIMETHOPRIM 800-160 MG PO TABS: 1.0000 | ORAL_TABLET | Freq: Two times a day (BID) | ORAL | Status: DC

## 2015-11-23 MED ORDER — MEPERIDINE HCL 25 MG/ML IJ SOLN: 6.2500 mg | INTRAMUSCULAR | Status: DC | PRN

## 2015-11-23 MED ORDER — MIDAZOLAM HCL 2 MG/2ML IJ SOLN: 1.0000 mg | INTRAMUSCULAR | Status: DC | PRN

## 2015-11-23 MED ORDER — HYDROMORPHONE HCL 1 MG/ML IJ SOLN
INTRAMUSCULAR | Status: AC
  Filled 2015-11-23: qty 1

## 2015-11-23 MED ORDER — FENTANYL CITRATE (PF) 100 MCG/2ML IJ SOLN
INTRAMUSCULAR | Status: DC | PRN
  Administered 2015-11-23 (×3): 25 ug via INTRAVENOUS
  Administered 2015-11-23: 50 ug via INTRAVENOUS
  Administered 2015-11-23: 100 ug via INTRAVENOUS
  Administered 2015-11-23 (×3): 25 ug via INTRAVENOUS

## 2015-11-23 MED ORDER — PROPOFOL 10 MG/ML IV BOLUS
INTRAVENOUS | Status: AC
  Filled 2015-11-23: qty 40

## 2015-11-23 MED ORDER — TRAMADOL HCL 50 MG PO TABS
50.0000 mg | ORAL_TABLET | Freq: Four times a day (QID) | ORAL | Status: DC | PRN
  Administered 2015-11-23 – 2015-11-24 (×3): 50 mg via ORAL
  Filled 2015-11-23 (×3): qty 1

## 2015-11-23 MED ORDER — KETOROLAC TROMETHAMINE 30 MG/ML IJ SOLN
INTRAMUSCULAR | Status: AC
  Filled 2015-11-23: qty 1

## 2015-11-23 MED ORDER — CEFAZOLIN SODIUM-DEXTROSE 2-3 GM-% IV SOLR
2.0000 g | INTRAVENOUS | Status: AC
  Administered 2015-11-23: 2 g via INTRAVENOUS

## 2015-11-23 MED ORDER — KETOROLAC TROMETHAMINE 30 MG/ML IJ SOLN: 30.0000 mg | Freq: Three times a day (TID) | INTRAMUSCULAR | Status: DC

## 2015-11-23 MED ORDER — FENTANYL CITRATE (PF) 100 MCG/2ML IJ SOLN
INTRAMUSCULAR | Status: AC
  Filled 2015-11-23: qty 2

## 2015-11-23 MED ORDER — LORATADINE 10 MG PO TABS: 10.0000 mg | ORAL_TABLET | Freq: Every day | ORAL | Status: DC

## 2015-11-23 MED ORDER — SCOPOLAMINE 1 MG/3DAYS TD PT72
1.0000 | MEDICATED_PATCH | Freq: Once | TRANSDERMAL | Status: DC | PRN
  Administered 2015-11-23: 1.5 mg via TRANSDERMAL

## 2015-11-23 MED ORDER — GLYCOPYRROLATE 0.2 MG/ML IJ SOLN: 0.2000 mg | Freq: Once | INTRAMUSCULAR | Status: DC | PRN

## 2015-11-23 MED ORDER — SODIUM CHLORIDE 0.9 % IJ SOLN
INTRAMUSCULAR | Status: AC
  Filled 2015-11-23: qty 30

## 2015-11-23 MED ORDER — SODIUM BICARBONATE 4 % IV SOLN
INTRAVENOUS | Status: DC | PRN
  Administered 2015-11-23: 300 mL via INTRAMUSCULAR

## 2015-11-23 MED ORDER — MIDAZOLAM HCL 2 MG/2ML IJ SOLN
INTRAMUSCULAR | Status: AC
  Filled 2015-11-23: qty 2

## 2015-11-23 MED ORDER — KETOROLAC TROMETHAMINE 30 MG/ML IJ SOLN
30.0000 mg | Freq: Three times a day (TID) | INTRAMUSCULAR | Status: DC
  Administered 2015-11-23 – 2015-11-24 (×2): 30 mg via INTRAVENOUS

## 2015-11-23 MED ORDER — PROPOFOL 10 MG/ML IV BOLUS
INTRAVENOUS | Status: DC | PRN
  Administered 2015-11-23: 200 mg via INTRAVENOUS
  Administered 2015-11-23: 50 mg via INTRAVENOUS

## 2015-11-23 MED ORDER — ONDANSETRON 4 MG PO TBDP: 4.0000 mg | ORAL_TABLET | Freq: Four times a day (QID) | ORAL | Status: DC | PRN

## 2015-11-23 MED ORDER — SCOPOLAMINE 1 MG/3DAYS TD PT72
MEDICATED_PATCH | TRANSDERMAL | Status: AC
  Filled 2015-11-23: qty 1

## 2015-11-23 MED ORDER — LIDOCAINE-EPINEPHRINE 1 %-1:100000 IJ SOLN
INTRAMUSCULAR | Status: AC
  Filled 2015-11-23: qty 1

## 2015-11-23 MED ORDER — DEXAMETHASONE SODIUM PHOSPHATE 4 MG/ML IJ SOLN
INTRAMUSCULAR | Status: DC | PRN
  Administered 2015-11-23: 10 mg via INTRAVENOUS

## 2015-11-23 MED ORDER — ONDANSETRON HCL 4 MG/2ML IJ SOLN
INTRAMUSCULAR | Status: DC | PRN
  Administered 2015-11-23: 4 mg via INTRAVENOUS

## 2015-11-23 MED ORDER — CEFAZOLIN SODIUM 1-5 GM-% IV SOLN
1.0000 g | Freq: Three times a day (TID) | INTRAVENOUS | Status: AC
  Administered 2015-11-23 – 2015-11-24 (×3): 1 g via INTRAVENOUS

## 2015-11-23 MED ORDER — LIDOCAINE HCL (PF) 1 % IJ SOLN
INTRAMUSCULAR | Status: AC
  Filled 2015-11-23: qty 60

## 2015-11-23 MED ORDER — HYDROMORPHONE HCL 1 MG/ML IJ SOLN
0.5000 mg | INTRAMUSCULAR | Status: DC | PRN
  Administered 2015-11-23 (×4): 0.5 mg via INTRAVENOUS
  Filled 2015-11-23 (×2): qty 1

## 2015-11-23 MED ORDER — TRAMADOL HCL 50 MG PO TABS: 50.0000 mg | ORAL_TABLET | Freq: Four times a day (QID) | ORAL | Status: DC | PRN

## 2015-11-23 MED ORDER — TAMOXIFEN CITRATE 20 MG PO TABS: 20.0000 mg | ORAL_TABLET | Freq: Every day | ORAL | Status: DC

## 2015-11-23 MED ORDER — BUPIVACAINE-EPINEPHRINE (PF) 0.25% -1:200000 IJ SOLN
INTRAMUSCULAR | Status: AC
  Filled 2015-11-23: qty 30

## 2015-11-23 MED ORDER — CHLORHEXIDINE GLUCONATE 4 % EX LIQD
1.0000 | Freq: Once | CUTANEOUS | Status: DC
Start: 2015-11-23 — End: 2015-11-23

## 2015-11-23 MED ORDER — ONDANSETRON HCL 4 MG/2ML IJ SOLN
INTRAMUSCULAR | Status: AC
  Filled 2015-11-23: qty 2

## 2015-11-23 MED ORDER — METHOCARBAMOL 500 MG PO TABS: 500.0000 mg | ORAL_TABLET | Freq: Four times a day (QID) | ORAL | Status: DC | PRN

## 2015-11-23 MED ORDER — LIDOCAINE HCL (CARDIAC) 20 MG/ML IV SOLN
INTRAVENOUS | Status: DC | PRN
  Administered 2015-11-23: 50 mg via INTRAVENOUS

## 2015-11-23 MED ORDER — CEFAZOLIN SODIUM 1-5 GM-% IV SOLN
INTRAVENOUS | Status: AC
  Filled 2015-11-23: qty 150

## 2015-11-23 MED ORDER — SODIUM CHLORIDE 0.9 % IV SOLN
INTRAVENOUS | Status: DC | PRN
  Administered 2015-11-23: 500 mL

## 2015-11-23 MED ORDER — DEXAMETHASONE SODIUM PHOSPHATE 10 MG/ML IJ SOLN
INTRAMUSCULAR | Status: AC
  Filled 2015-11-23: qty 1

## 2015-11-23 MED ORDER — OXYCODONE HCL 5 MG PO TABS: 5.0000 mg | ORAL_TABLET | Freq: Once | ORAL | Status: DC | PRN

## 2015-11-23 MED ORDER — LACTATED RINGERS IV SOLN
INTRAVENOUS | Status: DC
  Administered 2015-11-23: 08:00:00 via INTRAVENOUS
  Administered 2015-11-23: 10 mL/h via INTRAVENOUS
  Administered 2015-11-23: 07:00:00 via INTRAVENOUS

## 2015-11-23 MED ORDER — KCL IN DEXTROSE-NACL 20-5-0.45 MEQ/L-%-% IV SOLN
INTRAVENOUS | Status: DC
  Administered 2015-11-23: 12:00:00 via INTRAVENOUS
  Filled 2015-11-23 (×4): qty 1000

## 2015-11-23 MED ORDER — DIPHENHYDRAMINE HCL 50 MG/ML IJ SOLN
INTRAMUSCULAR | Status: AC
  Filled 2015-11-23: qty 1

## 2015-11-23 MED ORDER — HYDROMORPHONE HCL 1 MG/ML IJ SOLN
INTRAMUSCULAR | Status: AC
Start: 2015-11-23 — End: 2015-11-23
  Filled 2015-11-23: qty 1

## 2015-11-23 MED ORDER — CEFAZOLIN SODIUM-DEXTROSE 2-3 GM-% IV SOLR
INTRAVENOUS | Status: AC
  Filled 2015-11-23: qty 50

## 2015-11-23 MED ORDER — EPINEPHRINE HCL 1 MG/ML IJ SOLN
INTRAMUSCULAR | Status: AC
  Filled 2015-11-23: qty 1

## 2015-11-23 MED ORDER — MIDAZOLAM HCL 5 MG/5ML IJ SOLN
INTRAMUSCULAR | Status: DC | PRN
  Administered 2015-11-23: 2 mg via INTRAVENOUS

## 2015-11-23 MED ORDER — OXYCODONE HCL 5 MG/5ML PO SOLN: 5.0000 mg | Freq: Once | ORAL | Status: DC | PRN

## 2015-11-23 MED ORDER — HYDROMORPHONE HCL 1 MG/ML IJ SOLN
0.2500 mg | INTRAMUSCULAR | Status: DC | PRN
  Administered 2015-11-23 (×4): 0.5 mg via INTRAVENOUS

## 2015-11-23 MED ORDER — 0.9 % SODIUM CHLORIDE (POUR BTL) OPTIME
TOPICAL | Status: DC | PRN
  Administered 2015-11-23: 500 mL

## 2015-11-23 MED ORDER — LIDOCAINE HCL (CARDIAC) 20 MG/ML IV SOLN
INTRAVENOUS | Status: AC
  Filled 2015-11-23: qty 5

## 2015-11-23 MED ORDER — FENTANYL CITRATE (PF) 100 MCG/2ML IJ SOLN: 50.0000 ug | INTRAMUSCULAR | Status: DC | PRN

## 2015-11-23 MED ORDER — SODIUM BICARBONATE 4 % IV SOLN
INTRAVENOUS | Status: AC
  Filled 2015-11-23: qty 10

## 2015-11-23 SURGICAL SUPPLY — 70 items
BAG DECANTER FOR FLEXI CONT (MISCELLANEOUS) ×3 IMPLANT
BINDER ABDOMINAL 10 UNV 27-48 (MISCELLANEOUS) ×3 IMPLANT
BINDER BREAST XLRG (GAUZE/BANDAGES/DRESSINGS) ×3 IMPLANT
BLADE SURG 10 STRL SS (BLADE) ×3 IMPLANT
BLADE SURG 11 STRL SS (BLADE) ×3 IMPLANT
BLADE SURG 15 STRL LF DISP TIS (BLADE) ×2 IMPLANT
BLADE SURG 15 STRL SS (BLADE) ×1
BNDG GAUZE ELAST 4 BULKY (GAUZE/BANDAGES/DRESSINGS) ×6 IMPLANT
CANISTER LIPO FAT HARVEST (MISCELLANEOUS) ×3 IMPLANT
CANISTER SUCT 1200ML W/VALVE (MISCELLANEOUS) ×3 IMPLANT
CHLORAPREP W/TINT 26ML (MISCELLANEOUS) ×6 IMPLANT
COVER MAYO STAND STRL (DRAPES) ×6 IMPLANT
DRAIN CHANNEL 15F RND FF W/TCR (WOUND CARE) ×3 IMPLANT
DRSG PAD ABDOMINAL 8X10 ST (GAUZE/BANDAGES/DRESSINGS) ×12 IMPLANT
DRSG TEGADERM 4X4.75 (GAUZE/BANDAGES/DRESSINGS) ×3 IMPLANT
ELECT BLADE 4.0 EZ CLEAN MEGAD (MISCELLANEOUS) ×3
ELECT COATED BLADE 2.86 ST (ELECTRODE) ×3 IMPLANT
ELECT REM PT RETURN 9FT ADLT (ELECTROSURGICAL) ×3
ELECTRODE BLDE 4.0 EZ CLN MEGD (MISCELLANEOUS) ×2 IMPLANT
ELECTRODE REM PT RTRN 9FT ADLT (ELECTROSURGICAL) ×2 IMPLANT
EVACUATOR SILICONE 100CC (DRAIN) ×3 IMPLANT
FILTER LIPOSUCTION (MISCELLANEOUS) ×3 IMPLANT
GLOVE BIO SURGEON STRL SZ 6 (GLOVE) ×6 IMPLANT
GLOVE BIOGEL PI IND STRL 7.0 (GLOVE) ×2 IMPLANT
GLOVE BIOGEL PI INDICATOR 7.0 (GLOVE) ×1
GLOVE ECLIPSE 6.5 STRL STRAW (GLOVE) ×9 IMPLANT
GOWN STRL REUS W/ TWL LRG LVL3 (GOWN DISPOSABLE) ×4 IMPLANT
GOWN STRL REUS W/TWL LRG LVL3 (GOWN DISPOSABLE) ×2
IMPL GEL 500CC (Breast) ×2 IMPLANT
IMPLANT GEL 500CC (Breast) ×3 IMPLANT
LINER CANISTER 1000CC FLEX (MISCELLANEOUS) ×3 IMPLANT
LIQUID BAND (GAUZE/BANDAGES/DRESSINGS) ×6 IMPLANT
NDL SAFETY ECLIPSE 18X1.5 (NEEDLE) ×2 IMPLANT
NEEDLE HYPO 18GX1.5 SHARP (NEEDLE) ×1
NS IRRIG 1000ML POUR BTL (IV SOLUTION) ×3 IMPLANT
PACK BASIN DAY SURGERY FS (CUSTOM PROCEDURE TRAY) ×3 IMPLANT
PACK UNIVERSAL I (CUSTOM PROCEDURE TRAY) ×3 IMPLANT
PENCIL BUTTON HOLSTER BLD 10FT (ELECTRODE) ×3 IMPLANT
PIN SAFETY STERILE (MISCELLANEOUS) ×3 IMPLANT
SHEET MEDIUM DRAPE 40X70 STRL (DRAPES) ×6 IMPLANT
SIZER BREAST GENERIC (SIZER) ×6 IMPLANT
SIZER BREAST HP REUSE 500CC (SIZER) ×3
SIZER BREAST REUSE 535CC (SIZER) ×1
SIZER BREAST REUSE 590CC (SIZER) ×3
SIZER BRST HP REUSE 500CC (SIZER) ×2 IMPLANT
SIZER BRST REUSE 590CC (SIZER) ×2 IMPLANT
SIZER BRST REUSE ULT HI 535CC (SIZER) ×2 IMPLANT
SIZER GENERIC MENTOR (SIZER) ×3 IMPLANT
SLEEVE SCD COMPRESS KNEE MED (MISCELLANEOUS) ×3 IMPLANT
SPONGE LAP 18X18 X RAY DECT (DISPOSABLE) ×9 IMPLANT
STAPLER VISISTAT 35W (STAPLE) ×6 IMPLANT
SUT ETHILON 2 0 FS 18 (SUTURE) ×3 IMPLANT
SUT MNCRL AB 4-0 PS2 18 (SUTURE) ×9 IMPLANT
SUT SILK 3 0 PS 1 (SUTURE) IMPLANT
SUT VIC AB 3-0 PS1 18 (SUTURE) ×2
SUT VIC AB 3-0 PS1 18XBRD (SUTURE) ×4 IMPLANT
SUT VIC AB 3-0 SH 27 (SUTURE) ×1
SUT VIC AB 3-0 SH 27X BRD (SUTURE) ×2 IMPLANT
SUT VICRYL 4-0 PS2 18IN ABS (SUTURE) ×9 IMPLANT
SYR 50ML LL SCALE MARK (SYRINGE) ×12 IMPLANT
SYR BULB IRRIGATION 50ML (SYRINGE) ×3 IMPLANT
SYR TB 1ML LL NO SAFETY (SYRINGE) ×3 IMPLANT
SYRINGE 10CC LL (SYRINGE) ×12 IMPLANT
TAPE MEASURE ×3 IMPLANT
TOWEL OR 17X24 6PK STRL BLUE (TOWEL DISPOSABLE) ×6 IMPLANT
TUBE CONNECTING 20X1/4 (TUBING) ×6 IMPLANT
TUBING INFILTRATION IT-10001 (TUBING) ×3 IMPLANT
TUBING SET GRADUATE ASPIR 12FT (MISCELLANEOUS) ×3 IMPLANT
UNDERPAD 30X30 (UNDERPADS AND DIAPERS) ×6 IMPLANT
YANKAUER SUCT BULB TIP NO VENT (SUCTIONS) ×3 IMPLANT

## 2015-11-23 NOTE — Transfer of Care (Signed)
Immediate Anesthesia Transfer of Care Note  Patient: Loretta Sanchez  Procedure(s) Performed: Procedure(s): REMOVAL OF RIGHT TISSUE EXPANDER AND PLACEMENT OF IMPLANT (Right) LEFT MASTOPEXY FOR SYMMETRY (Left) LIPOSUCTION WITH LIPOFILLING TO RIGHT CHEST (Right)  Patient Location: PACU  Anesthesia Type:General  Level of Consciousness: awake and patient cooperative  Airway & Oxygen Therapy: Patient Spontanous Breathing and Patient connected to face mask oxygen  Post-op Assessment: Report given to RN and Post -op Vital signs reviewed and stable  Post vital signs: Reviewed and stable  Last Vitals:  Filed Vitals:   11/23/15 0654  BP: 135/83  Pulse: 77  Temp: 36.8 C  Resp: 20    Complications: No apparent anesthesia complications

## 2015-11-23 NOTE — Op Note (Signed)
Operative Note   DATE OF OPERATION: 3.14.17  LOCATION: [facility]- observation  SURGICAL DIVISION: Plastic Surgery  PREOPERATIVE DIAGNOSES:  1. Acquired absence right breat 2. History breast cancer 3. History therapeutic radiation 4. Asymmetry native and reconstructed breast  POSTOPERATIVE DIAGNOSES:  same  PROCEDURE:  1. Removal right tissue expander and placement silicone implant 2. Lipofilling to right chest 3. Left mastopexy  SURGEON: Irene Limbo MD MBA  ASSISTANT: none  ANESTHESIA:  General.   EBL: 50 ml  COMPLICATIONS: None immediate.   INDICATIONS FOR PROCEDURE:  The patient, Loretta Sanchez, is a 40 y.o. female born on 25-Jun-1976, is here for second stage reconstruction following right mastectomy and expander reconstruction.    FINDINGS: 115 ml fat infiltrated over right breast reconstruction. Mentor Smooth High Profile 500 ml implant placed, Ref [id number] SN [id number]  DESCRIPTION OF PROCEDURE:  The patient was marked with the patient in the preoperative area in standing position to mark areas over supra and infra umbilical abdomen for fat harvest, chest midline, sternal notch, breast meridians, inframammary folds. The location for nipple areolar complex marked on anterior surface left breast at 20 cm; this corresponded with anticipated location over right breast. With aid of Wise pattern marker, location of new nipple areolar complex and vertical limbs (7 cm) were marked. The patient was taken to the operating room. SCDs were placed and IV antibiotics were given. The patient's operative site was prepped and draped in a sterile fashion. A time out was performed and all information was confirmed to be correct.  Incision made through prior transverse scar and muscle divided in direction of fibers, capsule entered. Tissue expander removed. Submuscular dissection completed toward clavicle. Additional capsulotomies performed medially. Sizer placed in cavity and tailor  tacked closed.  Over left breast, nipple areolar complex marked with 45 mm diameter marker. Central pedicle utilized. Area marked pre operatively was deepithelialized and skin flaps developed for tension free rotation. Patient brought to upright sitting position and tailor tacked. Various sizers placed in right chest and 500 ml High Profile implant selected. Patient returned to supine position. Additional skin marked by tailor tacking was excised over inferior pole. Cavity irrigated and hemostasis obtained. Specimen was not weighed; skin only excised. Over left breast, closure completed with 3-0 vicryl approximate dermis along inframammary fold and vertical limb. NAC inset with 4-0 vicryl in dermis.Skin closure completed with 4-0 monocryl subcuticular.  Tumescent solution infiltrated over abdomoen through stab incisions. Total of 275 ml fluid infiltrated. Power assisted liposuction completed over supra and infraumbilical abdomen to endpoint of symmetric contour and soft tissue thickness. 200 ml lipoaspirate obtained approximately. Fat prepared for infiltration with washing and gravity. Stab incisions approximated with interrupted 4-0 monocryl. Using blunt canula, prepared fat placed subcutaneously and intramuscular over superior poles, to reconstruct lateral axillary tail breast, axilla, and total envelope grafting right chest. Breast cavity irrigated with solution containing Ancef, gentamicin and bacitracin. Hemostasis obtained. 15 Fr drain placed in cavity. Implant placed and layered closure completed with 3-0 vicryl to reapproximate pectoralis muscle. 4-0 vicryl placed in dermis followed by running 4-0 monocryl subcuticular for skin closure over breast. Tissue adhesive, followed by dry dressing and breast and abdominal binders applied.  The patient was allowed to wake from anesthesia, extubated and taken to the recovery room in satisfactory condition.   SPECIMENS: left mastopexy   DRAINS: 15 Fr JP in  right reconstructed breast

## 2015-11-23 NOTE — Anesthesia Preprocedure Evaluation (Signed)
Anesthesia Evaluation    History of Anesthesia Complications (+) PONV  Airway Mallampati: I  TM Distance: >3 FB Neck ROM: Full    Dental  (+) Teeth Intact, Dental Advisory Given   Pulmonary    breath sounds clear to auscultation       Cardiovascular  Rhythm:Regular Rate:Normal     Neuro/Psych    GI/Hepatic   Endo/Other    Renal/GU      Musculoskeletal   Abdominal   Peds  Hematology   Anesthesia Other Findings   Reproductive/Obstetrics                             Anesthesia Physical Anesthesia Plan  ASA: II  Anesthesia Plan: General   Post-op Pain Management:    Induction: Intravenous  Airway Management Planned: Oral ETT  Additional Equipment:   Intra-op Plan:   Post-operative Plan: Extubation in OR  Informed Consent: I have reviewed the patients History and Physical, chart, labs and discussed the procedure including the risks, benefits and alternatives for the proposed anesthesia with the patient or authorized representative who has indicated his/her understanding and acceptance.   Dental advisory given  Plan Discussed with: CRNA, Anesthesiologist and Surgeon  Anesthesia Plan Comments:         Anesthesia Quick Evaluation

## 2015-11-23 NOTE — Anesthesia Postprocedure Evaluation (Signed)
Anesthesia Post Note  Patient: Loretta Sanchez  Procedure(s) Performed: Procedure(s) (LRB): REMOVAL OF RIGHT TISSUE EXPANDER AND PLACEMENT OF IMPLANT (Right) LEFT MASTOPEXY FOR SYMMETRY (Left) LIPOSUCTION WITH LIPOFILLING TO RIGHT CHEST (Right)  Patient location during evaluation: PACU Anesthesia Type: General Level of consciousness: awake and alert Pain management: pain level controlled Vital Signs Assessment: post-procedure vital signs reviewed and stable Respiratory status: spontaneous breathing, nonlabored ventilation and respiratory function stable Cardiovascular status: blood pressure returned to baseline and stable Postop Assessment: no signs of nausea or vomiting Anesthetic complications: no    Last Vitals:  Filed Vitals:   11/23/15 1230 11/23/15 1400  BP:    Pulse: 95 95  Temp:    Resp: 12     Last Pain:  Filed Vitals:   11/23/15 1420  PainSc: Asleep                 Chayla Shands A

## 2015-11-23 NOTE — Anesthesia Procedure Notes (Signed)
Procedure Name: Intubation Performed by: Marrianne Mood Pre-anesthesia Checklist: Patient identified, Emergency Drugs available, Suction available, Patient being monitored and Timeout performed Patient Re-evaluated:Patient Re-evaluated prior to inductionOxygen Delivery Method: Circle System Utilized Preoxygenation: Pre-oxygenation with 100% oxygen Intubation Type: IV induction Ventilation: Mask ventilation without difficulty Laryngoscope Size: Miller and 3 Grade View: Grade II Tube type: Oral Tube size: 7.0 mm Number of attempts: 1 Airway Equipment and Method: Stylet and Oral airway Placement Confirmation: ETT inserted through vocal cords under direct vision,  positive ETCO2 and breath sounds checked- equal and bilateral Secured at: 21 cm Tube secured with: Tape Dental Injury: Teeth and Oropharynx as per pre-operative assessment

## 2015-11-23 NOTE — Discharge Instructions (Signed)

## 2015-11-23 NOTE — Interval H&P Note (Signed)
History and Physical Interval Note:  11/23/2015 6:50 AM  Loretta Sanchez  has presented today for surgery, with the diagnosis of HISTORY OF RIGHT BREAST CANCER, ACQUIRED ABSENCE BREAST, HISTORY OF RADIATION, ASYMMETRY NATIVE AND RECONSTRUCTIVE BREAST  The various methods of treatment have been discussed with the patient and family. After consideration of risks, benefits and other options for treatment, the patient has consented to  Procedure(s): REMOVAL OF RIGHT TISSUE EXPANDER AND PLACEMENT OF IMPLANT (Right) LEFT MASTOPEXY FOR SYMMETRY (Left) LIPOSUCTION WITH LIPOFILLING TO RIGHT CHEST (Right) as a surgical intervention .  The patient's history has been reviewed, patient examined, no change in status, stable for surgery.  I have reviewed the patient's chart and labs.  Questions were answered to the patient's satisfaction.     Shia Delaine

## 2015-11-24 ENCOUNTER — Encounter (HOSPITAL_BASED_OUTPATIENT_CLINIC_OR_DEPARTMENT_OTHER): Payer: Self-pay | Admitting: Plastic Surgery

## 2015-11-24 DIAGNOSIS — N651 Disproportion of reconstructed breast: Secondary | ICD-10-CM | POA: Diagnosis not present

## 2015-11-24 MED ORDER — DIPHENHYDRAMINE HCL 25 MG PO CAPS
ORAL_CAPSULE | ORAL | Status: AC
  Filled 2015-11-24: qty 1

## 2015-11-24 MED ORDER — KETOROLAC TROMETHAMINE 30 MG/ML IJ SOLN
INTRAMUSCULAR | Status: AC
  Filled 2015-11-24: qty 1

## 2015-11-24 MED ORDER — DIPHENHYDRAMINE HCL 25 MG PO CAPS
25.0000 mg | ORAL_CAPSULE | Freq: Four times a day (QID) | ORAL | Status: DC | PRN
  Administered 2015-11-24: 25 mg via ORAL

## 2015-11-24 NOTE — Addendum Note (Signed)
Addendum  created 11/24/15 1017 by Tawni Millers, CRNA   Modules edited: Charges VN

## 2015-12-06 ENCOUNTER — Other Ambulatory Visit: Payer: Self-pay | Admitting: *Deleted

## 2015-12-06 DIAGNOSIS — C50411 Malignant neoplasm of upper-outer quadrant of right female breast: Secondary | ICD-10-CM

## 2015-12-07 ENCOUNTER — Other Ambulatory Visit (HOSPITAL_BASED_OUTPATIENT_CLINIC_OR_DEPARTMENT_OTHER): Payer: BLUE CROSS/BLUE SHIELD

## 2015-12-07 DIAGNOSIS — C50411 Malignant neoplasm of upper-outer quadrant of right female breast: Secondary | ICD-10-CM

## 2015-12-07 LAB — COMPREHENSIVE METABOLIC PANEL
ALBUMIN: 3.6 g/dL (ref 3.5–5.0)
ALK PHOS: 66 U/L (ref 40–150)
ALT: 12 U/L (ref 0–55)
ANION GAP: 6 meq/L (ref 3–11)
AST: 16 U/L (ref 5–34)
BUN: 12.2 mg/dL (ref 7.0–26.0)
CALCIUM: 9 mg/dL (ref 8.4–10.4)
CO2: 30 mEq/L — ABNORMAL HIGH (ref 22–29)
Chloride: 106 mEq/L (ref 98–109)
Creatinine: 1 mg/dL (ref 0.6–1.1)
EGFR: 74 mL/min/{1.73_m2} — AB (ref >90)
Glucose: 120 mg/dl (ref 70–140)
POTASSIUM: 3.7 meq/L (ref 3.5–5.1)
Sodium: 142 mEq/L (ref 136–145)
Total Protein: 6.7 g/dL (ref 6.4–8.3)

## 2015-12-07 LAB — CBC WITH DIFFERENTIAL/PLATELET
BASO%: 0.9 % (ref 0.0–2.0)
BASOS ABS: 0 10*3/uL (ref 0.0–0.1)
EOS%: 3.1 % (ref 0.0–7.0)
Eosinophils Absolute: 0.2 10*3/uL (ref 0.0–0.5)
HEMATOCRIT: 32.6 % — AB (ref 34.8–46.6)
HEMOGLOBIN: 11.1 g/dL — AB (ref 11.6–15.9)
LYMPH#: 1.2 10*3/uL (ref 0.9–3.3)
LYMPH%: 23.5 % (ref 14.0–49.7)
MCH: 32 pg (ref 25.1–34.0)
MCHC: 34.2 g/dL (ref 31.5–36.0)
MCV: 93.6 fL (ref 79.5–101.0)
MONO#: 0.2 10*3/uL (ref 0.1–0.9)
MONO%: 4.4 % (ref 0.0–14.0)
NEUT#: 3.6 10*3/uL (ref 1.5–6.5)
NEUT%: 68.1 % (ref 38.4–76.8)
PLATELETS: 228 10*3/uL (ref 145–400)
RBC: 3.48 10*6/uL — ABNORMAL LOW (ref 3.70–5.45)
RDW: 11.8 % (ref 11.2–14.5)
WBC: 5.3 10*3/uL (ref 3.9–10.3)

## 2015-12-14 ENCOUNTER — Ambulatory Visit (HOSPITAL_BASED_OUTPATIENT_CLINIC_OR_DEPARTMENT_OTHER): Payer: BLUE CROSS/BLUE SHIELD | Admitting: Oncology

## 2015-12-14 ENCOUNTER — Telehealth: Payer: Self-pay | Admitting: Oncology

## 2015-12-14 VITALS — BP 120/63 | HR 72 | Temp 97.9°F | Resp 18 | Ht 69.0 in | Wt 156.0 lb

## 2015-12-14 DIAGNOSIS — C50411 Malignant neoplasm of upper-outer quadrant of right female breast: Secondary | ICD-10-CM | POA: Diagnosis not present

## 2015-12-14 DIAGNOSIS — R002 Palpitations: Secondary | ICD-10-CM | POA: Diagnosis not present

## 2015-12-14 NOTE — Telephone Encounter (Signed)
appt made and avs printed °
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
## 2015-12-24 ENCOUNTER — Encounter: Payer: Self-pay | Admitting: Genetic Counselor

## 2015-12-24 DIAGNOSIS — Z1379 Encounter for other screening for genetic and chromosomal anomalies: Secondary | ICD-10-CM | POA: Insufficient documentation

## 2016-01-11 DIAGNOSIS — Z01419 Encounter for gynecological examination (general) (routine) without abnormal findings: Secondary | ICD-10-CM | POA: Diagnosis not present

## 2016-01-11 DIAGNOSIS — Z853 Personal history of malignant neoplasm of breast: Secondary | ICD-10-CM | POA: Diagnosis not present

## 2016-01-11 DIAGNOSIS — N926 Irregular menstruation, unspecified: Secondary | ICD-10-CM | POA: Diagnosis not present

## 2016-01-11 DIAGNOSIS — Z6823 Body mass index (BMI) 23.0-23.9, adult: Secondary | ICD-10-CM | POA: Diagnosis not present

## 2016-01-18 DIAGNOSIS — Z3200 Encounter for pregnancy test, result unknown: Secondary | ICD-10-CM | POA: Diagnosis not present

## 2016-01-18 DIAGNOSIS — Z30430 Encounter for insertion of intrauterine contraceptive device: Secondary | ICD-10-CM | POA: Diagnosis not present

## 2016-02-23 DIAGNOSIS — Z9011 Acquired absence of right breast and nipple: Secondary | ICD-10-CM | POA: Diagnosis not present

## 2016-02-23 DIAGNOSIS — N651 Disproportion of reconstructed breast: Secondary | ICD-10-CM | POA: Diagnosis not present

## 2016-02-23 DIAGNOSIS — Z923 Personal history of irradiation: Secondary | ICD-10-CM | POA: Diagnosis not present

## 2016-03-16 DIAGNOSIS — Z30431 Encounter for routine checking of intrauterine contraceptive device: Secondary | ICD-10-CM | POA: Diagnosis not present

## 2016-03-29 DIAGNOSIS — Z923 Personal history of irradiation: Secondary | ICD-10-CM | POA: Diagnosis not present

## 2016-03-29 DIAGNOSIS — Z853 Personal history of malignant neoplasm of breast: Secondary | ICD-10-CM | POA: Diagnosis not present

## 2016-03-29 DIAGNOSIS — N651 Disproportion of reconstructed breast: Secondary | ICD-10-CM | POA: Diagnosis not present

## 2016-03-29 DIAGNOSIS — Z9011 Acquired absence of right breast and nipple: Secondary | ICD-10-CM | POA: Diagnosis not present

## 2016-04-18 NOTE — H&P (Signed)
  Subjective:    Patient ID: Loretta Sanchez is a 40 y.o. female.  HPI  5 months postop implant exchange. Bothered by step off superior chest on right and asymmetry upper pole fulness.Does not feel she is filling out bra equally with right smaller. Feels left NAC is high and pointing out.  History Right mastectomy, SLN, TE/ADM reconstruction. Patient required reexcision mastectomy flap for margins. 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 Repeat excision with clear margins, no additional tumor. Repeat oncotype again with too little tissue and no plan for chemotherapy. Completed XRT 04/09/15.    Presented with pain in the UOQ of right breast and had a brownish discharge from the right nipple. Underwent bilateral diagnostic MMG and right breast US. The breast density was category C. In the 9 o'clock area of the right breast there were numerous microcalcifications, extending over an area of 7 cm. There were calcifications in the retroareolar area as well in the right breast. US showed focally dilated ducts in the 9:00 position of the breast.U S of the right axilla was unremarkable as was the left breast. 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 MRI showed an area of confluent non-masslike enhancement throughout the outer right breast extending over 9 cm. Left breast unremarkable. Genetics panel negative.  Prior 38 B, happy with this. Mastectomy wt 834 g; let mastopexy skin only . Last MMG 10/2015 normal  Review of Systems     Objective:   Physical Exam  Cardiovascular: Normal rate, regular rhythm and normal heart sounds.   Pulmonary/Chest: Effort normal and breath sounds normal.   Chest: scars maturing, left with some minor bottoming out but NAC over breast mound, significant depression chest right superior pole, from lateral view projection breasts  similar with exception superior pole Abdomen infraumbilical with one transverse area of slight depression contour     Assessment:     R breast cancer 8.0 cm with positive margins anterior, s/p re resection S/p TE/ADM reconstruction Adjuvant radiation S/p silicone implant exchange, lipofilling, left mastopexy    Plan:      Plan for lipofilling and revision left mastopexy. Will utilize same scars on left. Reviewed native breast will continue to develop ptosis with aging, change with wt loss /gain, variable take fat grafting and risk fat necrosis. Plan harvest from flanks possible medial thighs, she will bring garment.  Pictures given to patient from last visit and prior to implant exchange.   We discussed she could certainly increase size of implant on right to address her concerns, at today's visit she does not feel it is significant enough to have additional surgery for this, also given radiated chest and will always have increased risk wound healing problems on this side. She does not nodular area right upper abdomen in area of drain site, appears to giver her concern as presence of mass. Counseled likely scar from drain exit or fat necrosis, can excise at time of surgery if desired.   Irene Limbo, MD [facility] Plastic & Reconstructive Surgery [phone], pin [phone]

## 2016-04-19 ENCOUNTER — Other Ambulatory Visit: Payer: Self-pay | Admitting: *Deleted

## 2016-04-19 MED ORDER — TAMOXIFEN CITRATE 20 MG PO TABS: 20.0000 mg | ORAL_TABLET | Freq: Every day | ORAL | 3 refills | Status: DC

## 2016-04-24 ENCOUNTER — Encounter (HOSPITAL_BASED_OUTPATIENT_CLINIC_OR_DEPARTMENT_OTHER): Payer: Self-pay | Admitting: *Deleted

## 2016-04-28 ENCOUNTER — Ambulatory Visit (HOSPITAL_BASED_OUTPATIENT_CLINIC_OR_DEPARTMENT_OTHER): Payer: BLUE CROSS/BLUE SHIELD | Admitting: Anesthesiology

## 2016-04-28 ENCOUNTER — Ambulatory Visit (HOSPITAL_BASED_OUTPATIENT_CLINIC_OR_DEPARTMENT_OTHER)
Admission: RE | Admit: 2016-04-28 | Discharge: 2016-04-28 | Disposition: A | Discharge status: Home / Self Care | Payer: BLUE CROSS/BLUE SHIELD | Source: Ambulatory Visit | Attending: Plastic Surgery | Admitting: Plastic Surgery

## 2016-04-28 ENCOUNTER — Encounter (HOSPITAL_BASED_OUTPATIENT_CLINIC_OR_DEPARTMENT_OTHER): Payer: Self-pay | Admitting: Anesthesiology

## 2016-04-28 ENCOUNTER — Encounter (HOSPITAL_BASED_OUTPATIENT_CLINIC_OR_DEPARTMENT_OTHER)
Admission: RE | Disposition: A | Discharge status: Home / Self Care | Payer: Self-pay | Source: Ambulatory Visit | Attending: Plastic Surgery

## 2016-04-28 DIAGNOSIS — N651 Disproportion of reconstructed breast: Secondary | ICD-10-CM | POA: Insufficient documentation

## 2016-04-28 DIAGNOSIS — Z79899 Other long term (current) drug therapy: Secondary | ICD-10-CM | POA: Diagnosis not present

## 2016-04-28 DIAGNOSIS — Z853 Personal history of malignant neoplasm of breast: Secondary | ICD-10-CM | POA: Insufficient documentation

## 2016-04-28 DIAGNOSIS — Z9011 Acquired absence of right breast and nipple: Secondary | ICD-10-CM | POA: Insufficient documentation

## 2016-04-28 DIAGNOSIS — Z923 Personal history of irradiation: Secondary | ICD-10-CM | POA: Diagnosis not present

## 2016-04-28 DIAGNOSIS — Z79810 Long term (current) use of selective estrogen receptor modulators (SERMs): Secondary | ICD-10-CM | POA: Insufficient documentation

## 2016-04-28 DIAGNOSIS — N6012 Diffuse cystic mastopathy of left breast: Secondary | ICD-10-CM | POA: Diagnosis not present

## 2016-04-28 HISTORY — PX: LIPOSUCTION WITH LIPOFILLING: SHX6436

## 2016-04-28 HISTORY — PX: MASTOPEXY: SHX5358

## 2016-04-28 SURGERY — LIPOSUCTION, WITH FAT TRANSFER
Anesthesia: General | Site: Chest | Laterality: Right

## 2016-04-28 MED ORDER — SUGAMMADEX SODIUM 200 MG/2ML IV SOLN
INTRAVENOUS | Status: DC | PRN
  Administered 2016-04-28: 140 mg via INTRAVENOUS

## 2016-04-28 MED ORDER — MIDAZOLAM HCL 2 MG/2ML IJ SOLN
INTRAMUSCULAR | Status: AC
  Filled 2016-04-28: qty 2

## 2016-04-28 MED ORDER — HYDROMORPHONE HCL 1 MG/ML IJ SOLN
INTRAMUSCULAR | Status: AC
  Filled 2016-04-28: qty 1

## 2016-04-28 MED ORDER — ROCURONIUM BROMIDE 100 MG/10ML IV SOLN
INTRAVENOUS | Status: DC | PRN
  Administered 2016-04-28: 50 mg via INTRAVENOUS

## 2016-04-28 MED ORDER — OXYCODONE HCL 5 MG PO TABS: 5.0000 mg | ORAL_TABLET | Freq: Once | ORAL | Status: DC | PRN

## 2016-04-28 MED ORDER — CEFAZOLIN SODIUM-DEXTROSE 2-4 GM/100ML-% IV SOLN
2.0000 g | INTRAVENOUS | Status: AC
  Administered 2016-04-28: 2 g via INTRAVENOUS

## 2016-04-28 MED ORDER — HYDROMORPHONE HCL 1 MG/ML IJ SOLN
0.2500 mg | INTRAMUSCULAR | Status: DC | PRN
  Administered 2016-04-28 (×2): 0.5 mg via INTRAVENOUS

## 2016-04-28 MED ORDER — PROPOFOL 500 MG/50ML IV EMUL
INTRAVENOUS | Status: DC | PRN
  Administered 2016-04-28: 150 ug/kg/min via INTRAVENOUS

## 2016-04-28 MED ORDER — LIDOCAINE HCL (CARDIAC) 20 MG/ML IV SOLN
INTRAVENOUS | Status: DC | PRN
  Administered 2016-04-28: 100 mg via INTRAVENOUS

## 2016-04-28 MED ORDER — KETOROLAC TROMETHAMINE 30 MG/ML IJ SOLN
INTRAMUSCULAR | Status: DC | PRN
  Administered 2016-04-28: 30 mg via INTRAVENOUS

## 2016-04-28 MED ORDER — CHLORHEXIDINE GLUCONATE CLOTH 2 % EX PADS: 6.0000 | MEDICATED_PAD | Freq: Once | CUTANEOUS | Status: DC

## 2016-04-28 MED ORDER — DEXAMETHASONE SODIUM PHOSPHATE 10 MG/ML IJ SOLN
INTRAMUSCULAR | Status: AC
  Filled 2016-04-28: qty 1

## 2016-04-28 MED ORDER — LACTATED RINGERS IV SOLN
INTRAVENOUS | Status: DC
  Administered 2016-04-28 (×3): via INTRAVENOUS

## 2016-04-28 MED ORDER — SODIUM BICARBONATE 4 % IV SOLN
INTRAVENOUS | Status: DC | PRN
  Administered 2016-04-28: 325 mL via INTRAMUSCULAR

## 2016-04-28 MED ORDER — SUGAMMADEX SODIUM 200 MG/2ML IV SOLN
INTRAVENOUS | Status: AC
  Filled 2016-04-28: qty 2

## 2016-04-28 MED ORDER — TRAMADOL HCL 50 MG PO TABS: 50.0000 mg | ORAL_TABLET | Freq: Four times a day (QID) | ORAL | 0 refills | Status: DC | PRN

## 2016-04-28 MED ORDER — PROMETHAZINE HCL 25 MG/ML IJ SOLN: 6.2500 mg | INTRAMUSCULAR | Status: DC | PRN

## 2016-04-28 MED ORDER — METOCLOPRAMIDE HCL 5 MG/ML IJ SOLN
INTRAMUSCULAR | Status: AC
  Filled 2016-04-28: qty 2

## 2016-04-28 MED ORDER — PROPOFOL 10 MG/ML IV BOLUS
INTRAVENOUS | Status: AC
  Filled 2016-04-28: qty 20

## 2016-04-28 MED ORDER — OXYCODONE HCL 5 MG/5ML PO SOLN: 5.0000 mg | Freq: Once | ORAL | Status: DC | PRN

## 2016-04-28 MED ORDER — TRAMADOL HCL 50 MG PO TABS
50.0000 mg | ORAL_TABLET | Freq: Four times a day (QID) | ORAL | Status: DC | PRN
  Administered 2016-04-28: 50 mg via ORAL

## 2016-04-28 MED ORDER — DEXAMETHASONE SODIUM PHOSPHATE 4 MG/ML IJ SOLN
INTRAMUSCULAR | Status: DC | PRN
  Administered 2016-04-28: 8 mg via INTRAVENOUS

## 2016-04-28 MED ORDER — MEPERIDINE HCL 25 MG/ML IJ SOLN: 6.2500 mg | INTRAMUSCULAR | Status: DC | PRN

## 2016-04-28 MED ORDER — FENTANYL CITRATE (PF) 100 MCG/2ML IJ SOLN
50.0000 ug | INTRAMUSCULAR | Status: AC | PRN
  Administered 2016-04-28 (×4): 50 ug via INTRAVENOUS

## 2016-04-28 MED ORDER — PROPOFOL 10 MG/ML IV BOLUS
INTRAVENOUS | Status: DC | PRN
  Administered 2016-04-28: 150 mg via INTRAVENOUS

## 2016-04-28 MED ORDER — ONDANSETRON HCL 4 MG/2ML IJ SOLN
INTRAMUSCULAR | Status: AC
  Filled 2016-04-28: qty 2

## 2016-04-28 MED ORDER — TRAMADOL HCL 50 MG PO TABS
ORAL_TABLET | ORAL | Status: AC
  Filled 2016-04-28: qty 1

## 2016-04-28 MED ORDER — LIDOCAINE HCL (PF) 1 % IJ SOLN
INTRAMUSCULAR | Status: AC
  Filled 2016-04-28: qty 30

## 2016-04-28 MED ORDER — SCOPOLAMINE 1 MG/3DAYS TD PT72: 1.0000 | MEDICATED_PATCH | Freq: Once | TRANSDERMAL | Status: DC | PRN

## 2016-04-28 MED ORDER — SODIUM BICARBONATE 4 % IV SOLN
INTRAVENOUS | Status: AC
  Filled 2016-04-28: qty 10

## 2016-04-28 MED ORDER — FENTANYL CITRATE (PF) 100 MCG/2ML IJ SOLN
INTRAMUSCULAR | Status: AC
Start: 2016-04-28 — End: 2016-04-28
  Filled 2016-04-28: qty 4

## 2016-04-28 MED ORDER — ACETAMINOPHEN 10 MG/ML IV SOLN
INTRAVENOUS | Status: AC
  Filled 2016-04-28: qty 100

## 2016-04-28 MED ORDER — MIDAZOLAM HCL 2 MG/2ML IJ SOLN
1.0000 mg | INTRAMUSCULAR | Status: DC | PRN
  Administered 2016-04-28: 2 mg via INTRAVENOUS

## 2016-04-28 MED ORDER — CEFAZOLIN SODIUM-DEXTROSE 2-4 GM/100ML-% IV SOLN
INTRAVENOUS | Status: AC
  Filled 2016-04-28: qty 100

## 2016-04-28 MED ORDER — ACETAMINOPHEN 10 MG/ML IV SOLN
INTRAVENOUS | Status: DC | PRN
  Administered 2016-04-28: 1000 mg via INTRAVENOUS

## 2016-04-28 MED ORDER — METOCLOPRAMIDE HCL 5 MG/ML IJ SOLN
INTRAMUSCULAR | Status: DC | PRN
  Administered 2016-04-28: 10 mg via INTRAVENOUS

## 2016-04-28 MED ORDER — GLYCOPYRROLATE 0.2 MG/ML IJ SOLN: 0.2000 mg | Freq: Once | INTRAMUSCULAR | Status: DC | PRN

## 2016-04-28 SURGICAL SUPPLY — 73 items
BAG DECANTER FOR FLEXI CONT (MISCELLANEOUS) ×3 IMPLANT
BINDER ABDOMINAL 10 UNV 27-48 (MISCELLANEOUS) IMPLANT
BINDER ABDOMINAL 12 SM 30-45 (SOFTGOODS) IMPLANT
BINDER BREAST LRG (GAUZE/BANDAGES/DRESSINGS) IMPLANT
BINDER BREAST MEDIUM (GAUZE/BANDAGES/DRESSINGS) IMPLANT
BINDER BREAST XLRG (GAUZE/BANDAGES/DRESSINGS) IMPLANT
BINDER BREAST XXLRG (GAUZE/BANDAGES/DRESSINGS) IMPLANT
BLADE SURG 10 STRL SS (BLADE) ×3 IMPLANT
BLADE SURG 11 STRL SS (BLADE) ×3 IMPLANT
BLADE SURG 15 STRL LF DISP TIS (BLADE) ×2 IMPLANT
BLADE SURG 15 STRL SS (BLADE) ×1
BNDG GAUZE ELAST 4 BULKY (GAUZE/BANDAGES/DRESSINGS) ×6 IMPLANT
CANISTER LIPO FAT HARVEST (MISCELLANEOUS) ×3 IMPLANT
CANISTER SUCT 1200ML W/VALVE (MISCELLANEOUS) ×3 IMPLANT
CHLORAPREP W/TINT 26ML (MISCELLANEOUS) ×6 IMPLANT
COMPRESSION GARMENT LG MOREWEL (MISCELLANEOUS) IMPLANT
COMPRESSION GARMENT MD MOREWEL (MISCELLANEOUS) IMPLANT
COMPRESSION GARMENT XL MOREWEL (MISCELLANEOUS) IMPLANT
COVER MAYO STAND STRL (DRAPES) ×3 IMPLANT
DECANTER SPIKE VIAL GLASS SM (MISCELLANEOUS) IMPLANT
DRAIN CHANNEL 19F RND (DRAIN) IMPLANT
DRAPE IMP U-DRAPE 54X76 (DRAPES) IMPLANT
DRSG PAD ABDOMINAL 8X10 ST (GAUZE/BANDAGES/DRESSINGS) ×9 IMPLANT
DRSG TEGADERM 2-3/8X2-3/4 SM (GAUZE/BANDAGES/DRESSINGS) IMPLANT
ELECT BLADE 4.0 EZ CLEAN MEGAD (MISCELLANEOUS)
ELECT COATED BLADE 2.86 ST (ELECTRODE) ×3 IMPLANT
ELECT REM PT RETURN 9FT ADLT (ELECTROSURGICAL) ×3
ELECTRODE BLDE 4.0 EZ CLN MEGD (MISCELLANEOUS) IMPLANT
ELECTRODE REM PT RTRN 9FT ADLT (ELECTROSURGICAL) ×2 IMPLANT
EVACUATOR SILICONE 100CC (DRAIN) IMPLANT
FILTER LIPOSUCTION (MISCELLANEOUS) ×3 IMPLANT
GAUZE SPONGE 4X4 12PLY STRL (GAUZE/BANDAGES/DRESSINGS) IMPLANT
GLOVE BIO SURGEON STRL SZ 6 (GLOVE) ×6 IMPLANT
GLOVE BIOGEL PI IND STRL 7.0 (GLOVE) ×6 IMPLANT
GLOVE BIOGEL PI INDICATOR 7.0 (GLOVE) ×3
GLOVE ECLIPSE 6.5 STRL STRAW (GLOVE) ×12 IMPLANT
GOWN STRL REUS W/ TWL LRG LVL3 (GOWN DISPOSABLE) ×6 IMPLANT
GOWN STRL REUS W/TWL LRG LVL3 (GOWN DISPOSABLE) ×3
LINER CANISTER 1000CC FLEX (MISCELLANEOUS) ×3 IMPLANT
LIQUID BAND (GAUZE/BANDAGES/DRESSINGS) ×6 IMPLANT
NDL SAFETY ECLIPSE 18X1.5 (NEEDLE) ×2 IMPLANT
NEEDLE HYPO 18GX1.5 SHARP (NEEDLE) ×1
NEEDLE HYPO 25X1 1.5 SAFETY (NEEDLE) IMPLANT
NS IRRIG 1000ML POUR BTL (IV SOLUTION) ×6 IMPLANT
PACK BASIN DAY SURGERY FS (CUSTOM PROCEDURE TRAY) ×3 IMPLANT
PACK UNIVERSAL I (CUSTOM PROCEDURE TRAY) ×3 IMPLANT
PAD ALCOHOL SWAB (MISCELLANEOUS) ×3 IMPLANT
PENCIL BUTTON HOLSTER BLD 10FT (ELECTRODE) ×3 IMPLANT
PIN SAFETY STERILE (MISCELLANEOUS) IMPLANT
SHEET MEDIUM DRAPE 40X70 STRL (DRAPES) ×3 IMPLANT
SLEEVE SCD COMPRESS KNEE MED (MISCELLANEOUS) ×3 IMPLANT
SPONGE GAUZE 4X4 12PLY STER LF (GAUZE/BANDAGES/DRESSINGS) IMPLANT
SPONGE LAP 18X18 X RAY DECT (DISPOSABLE) ×6 IMPLANT
STAPLER VISISTAT 35W (STAPLE) ×3 IMPLANT
STRIP CLOSURE SKIN 1/2X4 (GAUZE/BANDAGES/DRESSINGS) IMPLANT
SUT MNCRL AB 4-0 PS2 18 (SUTURE) ×6 IMPLANT
SUT VIC AB 3-0 PS1 18 (SUTURE) ×2
SUT VIC AB 3-0 PS1 18XBRD (SUTURE) ×4 IMPLANT
SUT VIC AB 3-0 SH 27 (SUTURE)
SUT VIC AB 3-0 SH 27X BRD (SUTURE) IMPLANT
SUT VICRYL 4-0 PS2 18IN ABS (SUTURE) ×3 IMPLANT
SYR 50ML LL SCALE MARK (SYRINGE) ×9 IMPLANT
SYR BULB IRRIGATION 50ML (SYRINGE) ×6 IMPLANT
SYR CONTROL 10ML LL (SYRINGE) IMPLANT
SYR TB 1ML LL NO SAFETY (SYRINGE) IMPLANT
SYRINGE 10CC LL (SYRINGE) ×9 IMPLANT
TAPE MEASURE VINYL STERILE (MISCELLANEOUS) IMPLANT
TOWEL OR 17X24 6PK STRL BLUE (TOWEL DISPOSABLE) ×6 IMPLANT
TUBE CONNECTING 20X1/4 (TUBING) ×3 IMPLANT
TUBING INFILTRATION IT-10001 (TUBING) ×3 IMPLANT
TUBING SET GRADUATE ASPIR 12FT (MISCELLANEOUS) ×3 IMPLANT
UNDERPAD 30X30 (UNDERPADS AND DIAPERS) ×6 IMPLANT
YANKAUER SUCT BULB TIP NO VENT (SUCTIONS) ×3 IMPLANT

## 2016-04-28 NOTE — Interval H&P Note (Signed)
History and Physical Interval Note:  04/28/2016 9:26 AM  Loretta Sanchez  has presented today for surgery, with the diagnosis of hx of right breast cancer aquired absense breast and asymmetry native and reconstructive breast  The various methods of treatment have been discussed with the patient and family. After consideration of risks, benefits and other options for treatment, the patient has consented to  Procedure(s): LIPOFILLING to right chest from abdomen (Right) revision left breast MASTOPEXY (Left) as a surgical intervention .  The patient's history has been reviewed, patient examined, no change in status, stable for surgery.  I have reviewed the patient's chart and labs.  Questions were answered to the patient's satisfaction.     Evely Gainey

## 2016-04-28 NOTE — Discharge Instructions (Signed)

## 2016-04-28 NOTE — Op Note (Signed)
Operative Note   DATE OF OPERATION: 8.18.17  LOCATION: [facility]-outpatient  SURGICAL DIVISION: Plastic Surgery  PREOPERATIVE DIAGNOSES:  1. History right breast cancer 2. Acquired absence breast 3. History therapeutic radiation 4. Asymmetry native and reconstructed breast  POSTOPERATIVE DIAGNOSES:  same  PROCEDURE:  1. Revision left mastopexy 2. Lipofilling to right chest  SURGEON: Irene Limbo MD MBA  ASSISTANT: none  ANESTHESIA:  General.   EBL: 30 ml  COMPLICATIONS: None immediate.   INDICATIONS FOR PROCEDURE:  The patient, Loretta Sanchez, is a 40 y.o. female born on 09-01-1976, is here for revision breast reconstruction. She has a history of implant based reconstruction following skin sparing mastectomy on right and left mastopexy.    FINDINGS: 110 ml fat infiltrated over superior pole right reconstruction.  DESCRIPTION OF PROCEDURE:  The patient was marked with the patient in the preoperative area in standing position to mark areas over infra umbilical abdomen and bilateral flanks for fat harvest and areas of depression superior pole right reconstruction for filling. Over left breast additional skin marked by pinch over vertical limbs and inframammary scar for resection. The patient was taken to the operating room. SCDs were placed and IV antibiotics were given. The patient's operative site was prepped and draped in a sterile fashion. A time out was performed and all information was confirmed to be correct.  Tumescent solution infiltrated over abdomen through stab incisions in area prior scar. Total of 300 ml fluid infiltrated. Power assisted liposuction completed over bilateral flanks and infraumbilical abdomen to endpoint of symmetric contour and soft tissue thickness. Fat prepared for infiltration with washing and gravity. Stab incisions approximated with interrupted 4-0 monocryl. Using blunt canula, prepared fat placed subcutaneously over superior poles, axilla,  small amount lateral chest on right.   Over left breast, patient brought to upright sitting position and tailor tacked for mastopexy. Nipple to IMF marked at 8 cm. Skin marked by tailor tacking was excised and breast tissue excised from lower pole. Cavity irrigated and hemostasis obtained. Closure completed with 3-0 and 4-0 vicryl approximate dermis along inframammary fold and vertical limb. Skin closure completed with 4-0 monocryl subcuticular. Tissue adhesive, followed by dry dressing and breast and abdominal binders applied.  The patient was allowed to wake from anesthesia, extubated and taken to the recovery room in satisfactory condition.   SPECIMENS: left mastopexy  DRAINS: none  Irene Limbo, MD [facility] Plastic & Reconstructive Surgery [phone], pin [phone]

## 2016-04-28 NOTE — Anesthesia Preprocedure Evaluation (Signed)
Anesthesia Evaluation  Patient identified by MRN, date of birth, ID band Patient awake    Reviewed: Allergy & Precautions, NPO status , Patient's Chart, lab work & pertinent test results  History of Anesthesia Complications (+) PONV, Family history of anesthesia reaction and history of anesthetic complications  Airway Mallampati: I  TM Distance: >3 FB Neck ROM: Full    Dental  (+) Teeth Intact, Dental Advisory Given   Pulmonary neg pulmonary ROS,    breath sounds clear to auscultation       Cardiovascular negative cardio ROS  + Valvular Problems/Murmurs  Rhythm:Regular Rate:Normal     Neuro/Psych  Headaches, negative neurological ROS  negative psych ROS   GI/Hepatic negative GI ROS, Neg liver ROS,   Endo/Other  negative endocrine ROS  Renal/GU negative Renal ROS     Musculoskeletal negative musculoskeletal ROS (+)   Abdominal   Peds  Hematology negative hematology ROS (+)   Anesthesia Other Findings   Reproductive/Obstetrics negative OB ROS                             Anesthesia Physical  Anesthesia Plan  ASA: II  Anesthesia Plan: General   Post-op Pain Management:    Induction: Intravenous  Airway Management Planned: Oral ETT  Additional Equipment:   Intra-op Plan:   Post-operative Plan: Extubation in OR  Informed Consent: I have reviewed the patients History and Physical, chart, labs and discussed the procedure including the risks, benefits and alternatives for the proposed anesthesia with the patient or authorized representative who has indicated his/her understanding and acceptance.   Dental advisory given  Plan Discussed with: CRNA  Anesthesia Plan Comments:                                         Anesthesia Evaluation    History of Anesthesia Complications (+) PONV  Airway Mallampati: I  TM Distance: >3 FB Neck ROM: Full    Dental  (+) Teeth  Intact, Dental Advisory Given   Pulmonary    breath sounds clear to auscultation       Cardiovascular  Rhythm:Regular Rate:Normal     Neuro/Psych    GI/Hepatic   Endo/Other    Renal/GU      Musculoskeletal   Abdominal   Peds  Hematology   Anesthesia Other Findings   Reproductive/Obstetrics                             Anesthesia Physical Anesthesia Plan  ASA: II  Anesthesia Plan: General   Post-op Pain Management:    Induction: Intravenous  Airway Management Planned: Oral ETT  Additional Equipment:   Intra-op Plan:   Post-operative Plan: Extubation in OR  Informed Consent: I have reviewed the patients History and Physical, chart, labs and discussed the procedure including the risks, benefits and alternatives for the proposed anesthesia with the patient or authorized representative who has indicated his/her understanding and acceptance.   Dental advisory given  Plan Discussed with: CRNA, Anesthesiologist and Surgeon  Anesthesia Plan Comments:         Anesthesia Quick Evaluation                                   Anesthesia  Evaluation  Patient identified by MRN, date of birth, ID band Patient awake    Reviewed: Allergy & Precautions, H&P , Patient's Chart, lab work & pertinent test results, reviewed documented beta blocker date and time   Airway Mallampati: II  TM Distance: >3 FB Neck ROM: full    Dental no notable dental hx.    Pulmonary  breath sounds clear to auscultation  Pulmonary exam normal       Cardiovascular Rhythm:regular Rate:Normal     Neuro/Psych    GI/Hepatic   Endo/Other    Renal/GU      Musculoskeletal   Abdominal   Peds  Hematology   Anesthesia Other Findings PONV; avoid narc's till PACU if needed. Use multi-modal analgesics  Reproductive/Obstetrics                            Anesthesia Physical Anesthesia Plan  ASA:  II  Anesthesia Plan:    Post-op Pain Management:    Induction: Intravenous  Airway Management Planned: LMA  Additional Equipment:   Intra-op Plan:   Post-operative Plan:   Informed Consent: I have reviewed the patients History and Physical, chart, labs and discussed the procedure including the risks, benefits and alternatives for the proposed anesthesia with the patient or authorized representative who has indicated his/her understanding and acceptance.   Dental Advisory Given and Dental advisory given  Plan Discussed with: CRNA and Surgeon  Anesthesia Plan Comments: (Discussed GA with LMA, possible sore throat, potential need to switch to ETT, N/V, pulmonary aspiration. Questions answered. )        Anesthesia Quick Evaluation  Anesthesia Quick Evaluation

## 2016-04-28 NOTE — Anesthesia Procedure Notes (Signed)
Procedure Name: Intubation Date/Time: 04/28/2016 10:26 AM Performed by: Georgeanne Nim Pre-anesthesia Checklist: Patient identified, Emergency Drugs available, Suction available, Patient being monitored and Timeout performed Patient Re-evaluated:Patient Re-evaluated prior to inductionOxygen Delivery Method: Circle system utilized Preoxygenation: Pre-oxygenation with 100% oxygen Intubation Type: IV induction Ventilation: Mask ventilation without difficulty Laryngoscope Size: Mac and 3 Grade View: Grade I Tube type: Oral Number of attempts: 1 Airway Equipment and Method: Stylet Placement Confirmation: ETT inserted through vocal cords under direct vision,  positive ETCO2 and breath sounds checked- equal and bilateral Secured at: 21 cm Tube secured with: Tape Dental Injury: Teeth and Oropharynx as per pre-operative assessment

## 2016-04-28 NOTE — Anesthesia Postprocedure Evaluation (Signed)
Anesthesia Post Note  Patient: Loretta Sanchez  Procedure(s) Performed: Procedure(s) (LRB): LIPOSUCTION WITH LIPOFILLING to right chest from abdomen (Right) Revision left breast MASTOPEXY (Left)  Patient location during evaluation: PACU Anesthesia Type: General Level of consciousness: sedated and patient cooperative Pain management: pain level controlled Vital Signs Assessment: post-procedure vital signs reviewed and stable Respiratory status: spontaneous breathing Cardiovascular status: stable Anesthetic complications: no    Last Vitals:  Vitals:   04/28/16 1230 04/28/16 1315  BP:  139/81  Pulse:  75  Resp:  18  Temp: 36.6 C 36.7 C    Last Pain:  Vitals:   04/28/16 1315  TempSrc:   PainSc: 3                  Nolon Nations

## 2016-04-28 NOTE — Transfer of Care (Signed)
Immediate Anesthesia Transfer of Care Note  Patient: Loretta Sanchez  Procedure(s) Performed: Procedure(s): LIPOSUCTION WITH LIPOFILLING to right chest from abdomen (Right) Revision left breast MASTOPEXY (Left)  Patient Location: PACU  Anesthesia Type:General  Level of Consciousness: awake, alert , oriented and patient cooperative  Airway & Oxygen Therapy: Patient Spontanous Breathing and Patient connected to face mask oxygen  Post-op Assessment: Report given to RN and Post -op Vital signs reviewed and stable  Post vital signs: Reviewed and stable  Last Vitals:  Vitals:   04/28/16 0946  BP: 135/88  Pulse: 83  Resp: 18  Temp: 36.8 C    Last Pain:  Vitals:   04/28/16 0946  TempSrc: Oral         Complications: No apparent anesthesia complications

## 2016-05-01 ENCOUNTER — Encounter (HOSPITAL_BASED_OUTPATIENT_CLINIC_OR_DEPARTMENT_OTHER): Payer: Self-pay | Admitting: Plastic Surgery

## 2016-05-17 DIAGNOSIS — B351 Tinea unguium: Secondary | ICD-10-CM | POA: Diagnosis not present

## 2016-05-17 DIAGNOSIS — D239 Other benign neoplasm of skin, unspecified: Secondary | ICD-10-CM | POA: Diagnosis not present

## 2016-07-13 ENCOUNTER — Other Ambulatory Visit (HOSPITAL_BASED_OUTPATIENT_CLINIC_OR_DEPARTMENT_OTHER): Payer: BLUE CROSS/BLUE SHIELD

## 2016-07-13 DIAGNOSIS — C50411 Malignant neoplasm of upper-outer quadrant of right female breast: Secondary | ICD-10-CM | POA: Diagnosis not present

## 2016-07-13 LAB — COMPREHENSIVE METABOLIC PANEL
ALBUMIN: 4 g/dL (ref 3.5–5.0)
ALK PHOS: 73 U/L (ref 40–150)
ALT: 14 U/L (ref 0–55)
AST: 18 U/L (ref 5–34)
Anion Gap: 8 mEq/L (ref 3–11)
BILIRUBIN TOTAL: 0.38 mg/dL (ref 0.20–1.20)
BUN: 9.5 mg/dL (ref 7.0–26.0)
CO2: 28 mEq/L (ref 22–29)
CREATININE: 0.9 mg/dL (ref 0.6–1.1)
Calcium: 9.3 mg/dL (ref 8.4–10.4)
Chloride: 104 mEq/L (ref 98–109)
EGFR: 84 mL/min/{1.73_m2} — ABNORMAL LOW (ref >90)
GLUCOSE: 94 mg/dL (ref 70–140)
Potassium: 3.8 mEq/L (ref 3.5–5.1)
SODIUM: 140 meq/L (ref 136–145)
Total Protein: 6.8 g/dL (ref 6.4–8.3)

## 2016-07-13 LAB — CBC WITH DIFFERENTIAL/PLATELET
BASO%: 0.5 % (ref 0.0–2.0)
Basophils Absolute: 0 10*3/uL (ref 0.0–0.1)
EOS%: 1.1 % (ref 0.0–7.0)
Eosinophils Absolute: 0.1 10*3/uL (ref 0.0–0.5)
HEMATOCRIT: 36.9 % (ref 34.8–46.6)
HEMOGLOBIN: 12.3 g/dL (ref 11.6–15.9)
LYMPH#: 1.7 10*3/uL (ref 0.9–3.3)
LYMPH%: 23.2 % (ref 14.0–49.7)
MCH: 31.5 pg (ref 25.1–34.0)
MCHC: 33.4 g/dL (ref 31.5–36.0)
MCV: 94.4 fL (ref 79.5–101.0)
MONO#: 0.3 10*3/uL (ref 0.1–0.9)
MONO%: 4.7 % (ref 0.0–14.0)
NEUT%: 70.5 % (ref 38.4–76.8)
NEUTROS ABS: 5.1 10*3/uL (ref 1.5–6.5)
Platelets: 196 10*3/uL (ref 145–400)
RBC: 3.92 10*6/uL (ref 3.70–5.45)
RDW: 11.9 % (ref 11.2–14.5)
WBC: 7.2 10*3/uL (ref 3.9–10.3)

## 2016-07-20 ENCOUNTER — Ambulatory Visit (HOSPITAL_BASED_OUTPATIENT_CLINIC_OR_DEPARTMENT_OTHER): Payer: BLUE CROSS/BLUE SHIELD | Admitting: Oncology

## 2016-07-20 VITALS — BP 153/73 | HR 73 | Temp 98.5°F | Resp 18 | Ht 69.0 in | Wt 153.1 lb

## 2016-07-20 DIAGNOSIS — D0511 Intraductal carcinoma in situ of right breast: Secondary | ICD-10-CM

## 2016-07-20 DIAGNOSIS — Z0000 Encounter for general adult medical examination without abnormal findings: Secondary | ICD-10-CM | POA: Diagnosis not present

## 2016-07-20 DIAGNOSIS — Z79810 Long term (current) use of selective estrogen receptor modulators (SERMs): Secondary | ICD-10-CM

## 2016-07-20 DIAGNOSIS — Z170 Estrogen receptor positive status [ER+]: Secondary | ICD-10-CM

## 2016-07-20 DIAGNOSIS — C50411 Malignant neoplasm of upper-outer quadrant of right female breast: Secondary | ICD-10-CM | POA: Diagnosis not present

## 2016-07-20 DIAGNOSIS — E784 Other hyperlipidemia: Secondary | ICD-10-CM | POA: Diagnosis not present

## 2016-07-20 MED ORDER — TAMOXIFEN CITRATE 20 MG PO TABS: 20.0000 mg | ORAL_TABLET | Freq: Every day | ORAL | 3 refills | Status: DC
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
## 2016-07-26 ENCOUNTER — Telehealth: Payer: Self-pay | Admitting: Oncology

## 2016-07-26 NOTE — Telephone Encounter (Signed)
Left message re May lab/fu. Schedule mailed.

## 2016-07-27 DIAGNOSIS — Z0000 Encounter for general adult medical examination without abnormal findings: Secondary | ICD-10-CM | POA: Diagnosis not present

## 2016-07-27 DIAGNOSIS — E784 Other hyperlipidemia: Secondary | ICD-10-CM | POA: Diagnosis not present

## 2016-07-27 DIAGNOSIS — Z1389 Encounter for screening for other disorder: Secondary | ICD-10-CM | POA: Diagnosis not present

## 2016-07-27 DIAGNOSIS — J3089 Other allergic rhinitis: Secondary | ICD-10-CM | POA: Diagnosis not present

## 2016-07-27 DIAGNOSIS — D0511 Intraductal carcinoma in situ of right breast: Secondary | ICD-10-CM | POA: Diagnosis not present

## 2016-07-27 DIAGNOSIS — B351 Tinea unguium: Secondary | ICD-10-CM | POA: Diagnosis not present

## 2016-08-07 DIAGNOSIS — R202 Paresthesia of skin: Secondary | ICD-10-CM | POA: Diagnosis not present

## 2016-08-07 DIAGNOSIS — R200 Anesthesia of skin: Secondary | ICD-10-CM | POA: Diagnosis not present

## 2016-08-07 DIAGNOSIS — M79601 Pain in right arm: Secondary | ICD-10-CM | POA: Diagnosis not present

## 2016-08-08 DIAGNOSIS — R202 Paresthesia of skin: Secondary | ICD-10-CM | POA: Diagnosis not present

## 2016-08-08 DIAGNOSIS — M79601 Pain in right arm: Secondary | ICD-10-CM | POA: Diagnosis not present

## 2016-08-08 DIAGNOSIS — R200 Anesthesia of skin: Secondary | ICD-10-CM | POA: Diagnosis not present

## 2016-08-09 DIAGNOSIS — R200 Anesthesia of skin: Secondary | ICD-10-CM | POA: Diagnosis not present

## 2016-08-09 DIAGNOSIS — R202 Paresthesia of skin: Secondary | ICD-10-CM | POA: Diagnosis not present

## 2016-08-25 DIAGNOSIS — Z9012 Acquired absence of left breast and nipple: Secondary | ICD-10-CM | POA: Diagnosis not present

## 2016-08-25 DIAGNOSIS — C50911 Malignant neoplasm of unspecified site of right female breast: Secondary | ICD-10-CM | POA: Diagnosis not present

## 2016-09-15 DIAGNOSIS — C50911 Malignant neoplasm of unspecified site of right female breast: Secondary | ICD-10-CM | POA: Diagnosis not present

## 2016-09-18 IMAGING — MR MR BREAST BILATERAL W WO CONTRAST
8 of 13 series · 29 of 48 positions shown · IV contrast (15ml Multihance)
Comparison: Previous exam(s).

CLINICAL DATA: 39-year-old female with newly diagnosed right breast
ductal carcinoma in situ with papillary features and lobular
neoplasia.

LABS:  None performed today.
EXAM:
BILATERAL BREAST MRI WITH AND WITHOUT CONTRAST
TECHNIQUE: Multiplanar, multisequence MR images of both breasts were obtained
prior to and following the intravenous administration of 15 ml of
Multihance.

[Series 2: T2 · axial · 3.0mm · 0.94mm/px · z∈[-124,+98]mm · 3 of 75 slices shown]
[im 1/75]
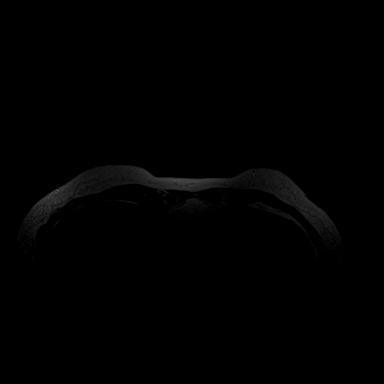
[im 38/75]
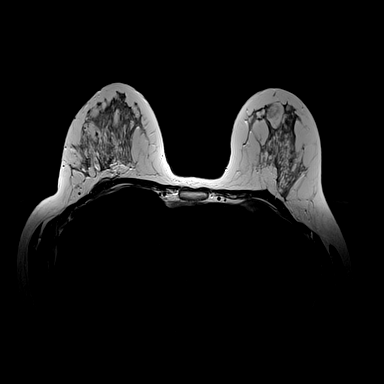
[im 75/75]
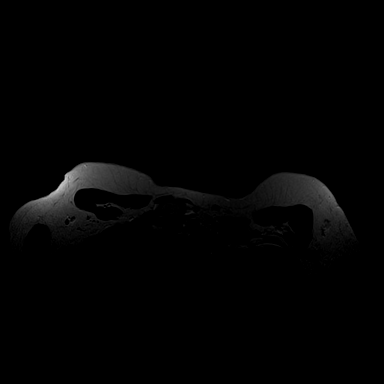

[Series 3: t2_tirm_tra ipat (a-p) · axial · 3.0mm · 0.70mm/px · z∈[-124,+98]mm · 2 of 75 slices shown]
[im 1/75]
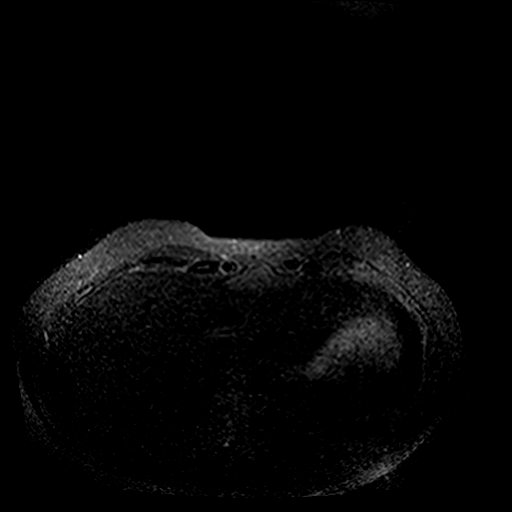
[im 75/75]
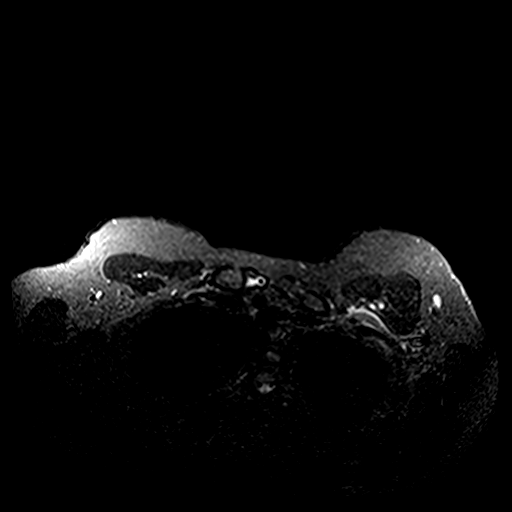

[Series 4: fl3d pre-cm no · axial · non-contrast · 0.9mm · 0.94mm/px · z∈[-106,+80]mm · 5 of 208 slices shown]
[im 1/208]
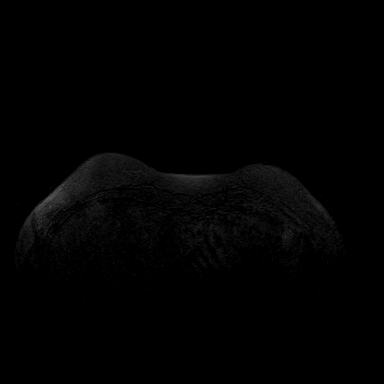
[im 52/208]
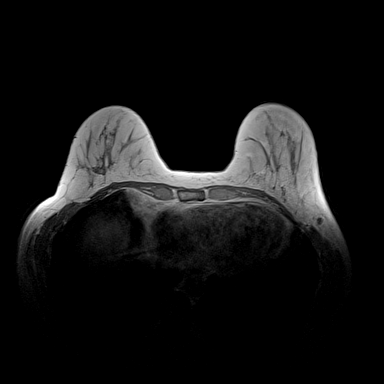
[im 104/208]
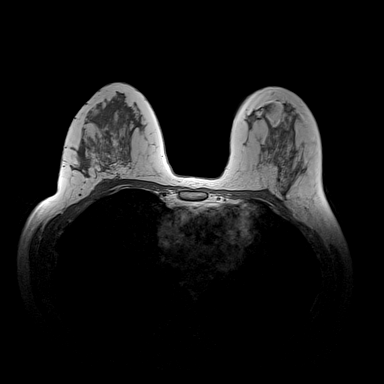
[im 156/208]
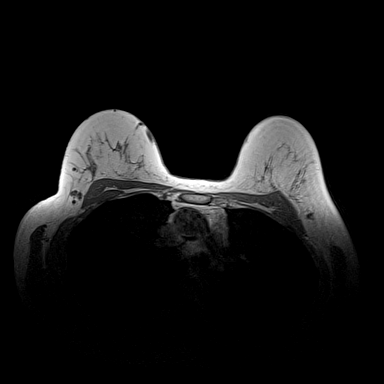
[im 208/208]
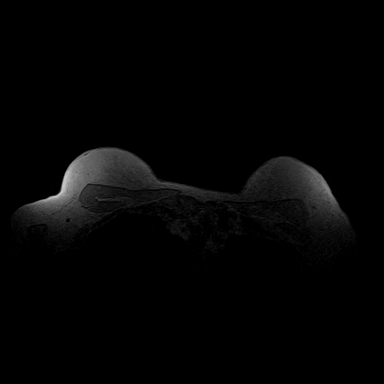

[Series 5: fl3d pre-cm · axial · non-contrast · 0.9mm · 0.87mm/px · z∈[-106,+80]mm · 5 of 208 slices shown]
[im 1/208]
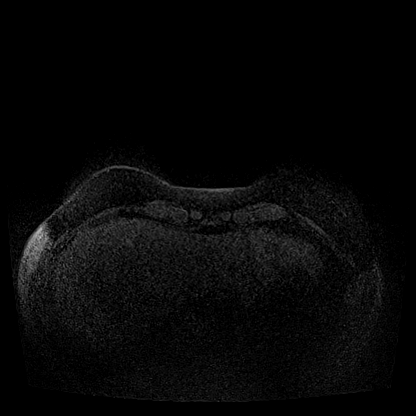
[im 52/208]
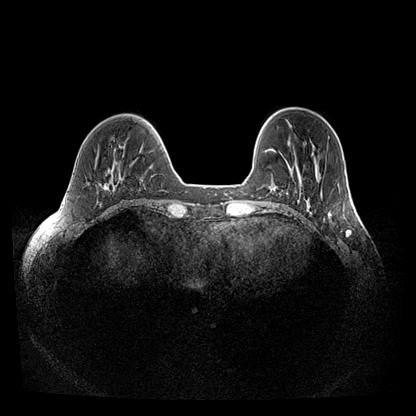
[im 104/208]
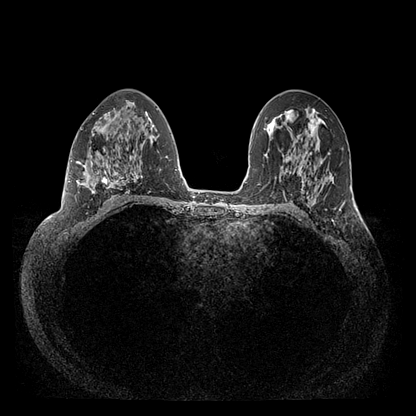
[im 156/208]
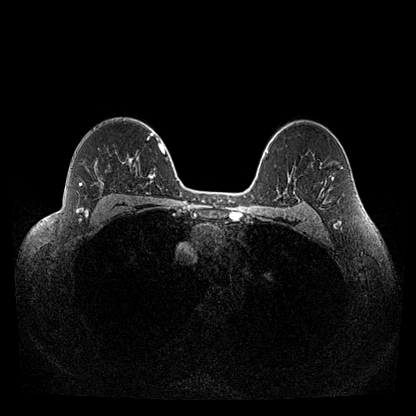
[im 208/208]
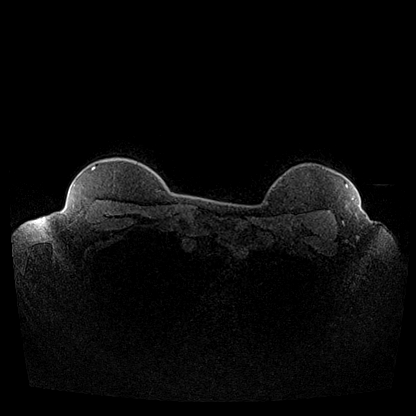

[Series 6: fl3d post-cm 20 · axial · 0.9mm · 0.87mm/px · z∈[-106,+80]mm · 5 of 208 slices shown (1 of 3)]
[im 1/208]
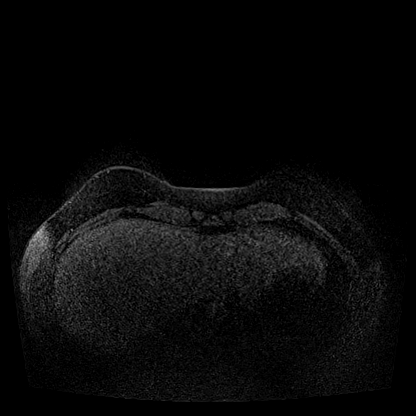
[im 52/208]
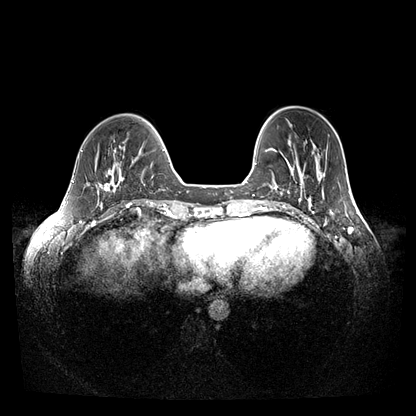
[im 104/208]
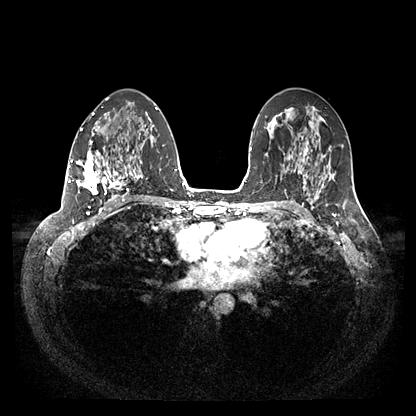
[im 156/208]
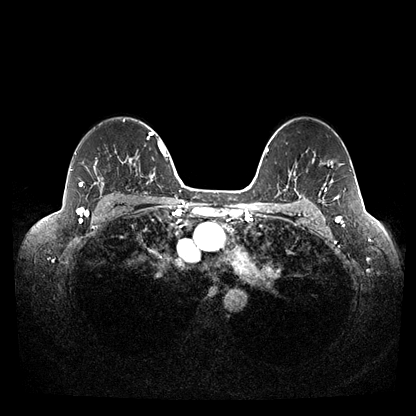
[im 208/208]
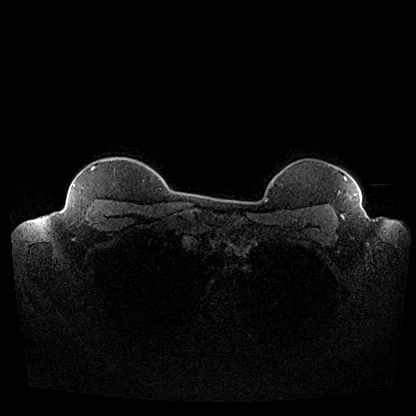

[Series 7: fl3d post-cm 20 · axial · 0.9mm · 0.87mm/px · z∈[-106,+80]mm · 5 of 208 slices shown (2 of 3)]
[im 1/208]
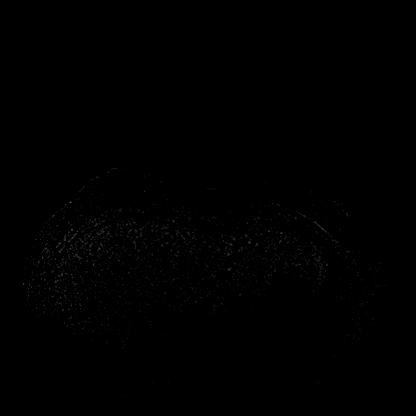
[im 52/208]
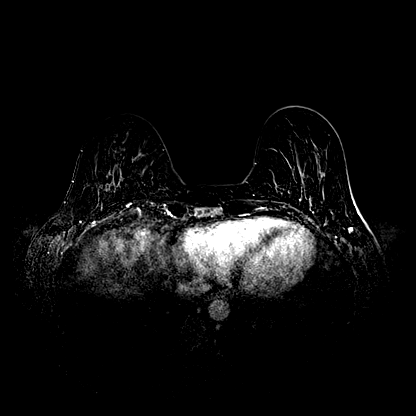
[im 104/208]
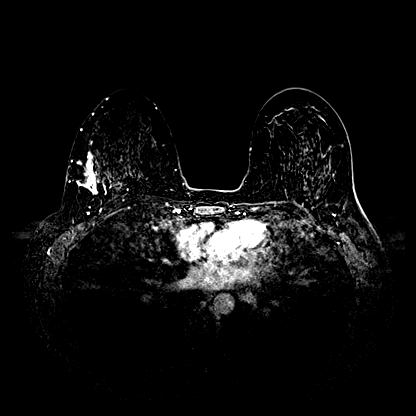
[im 156/208]
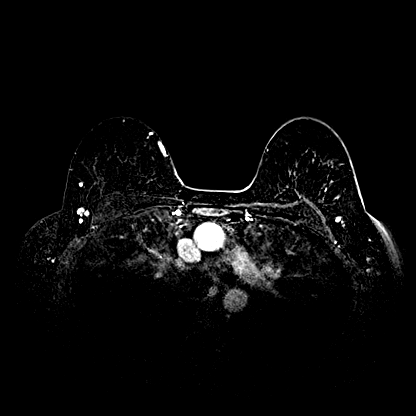
[im 208/208]
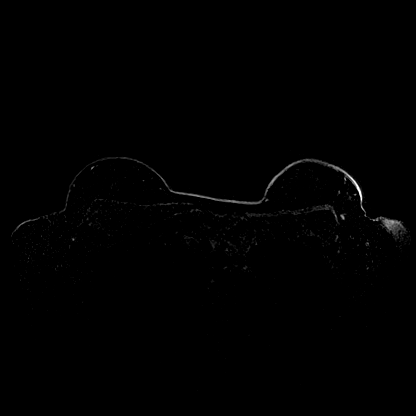

[Series 8: fl3d post-cm 20 · axial · 187.2mm · 0.87mm/px · 1 of 1 slices shown (3 of 3)]
[im 1/1]
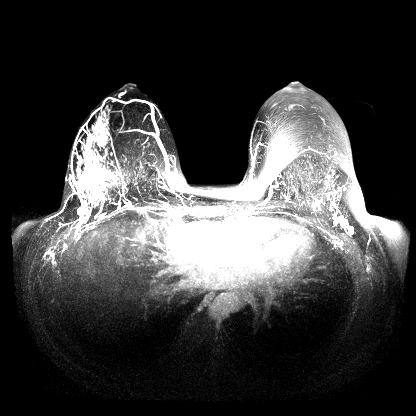

[Series 9: fl3d post-cm 3min · axial · 0.9mm · 0.87mm/px · z∈[-106,-13]mm · 3 of 208 slices shown]
[im 1/208]
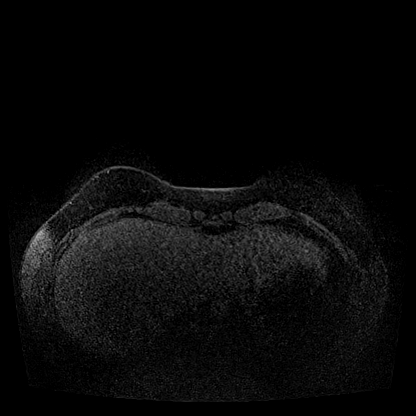
[im 52/208]
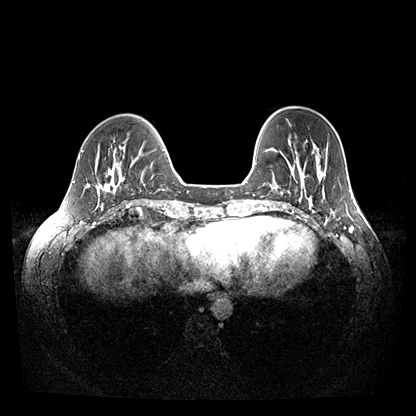
[im 104/208]
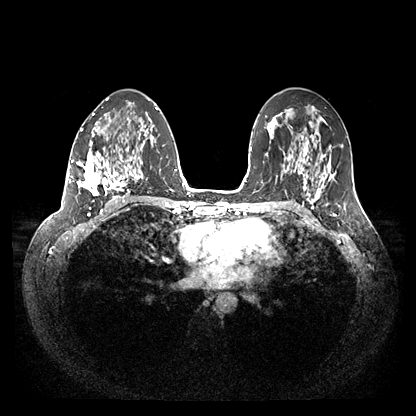

[29 of 48 positions shown; findings below may reference images not displayed]

THREE-DIMENSIONAL MR IMAGE RENDERING ON INDEPENDENT WORKSTATION:

Three-dimensional MR images were rendered by post-processing of the
original MR data on an independent workstation. The
three-dimensional MR images were interpreted, and findings are
reported in the following complete MRI report for this study. Three
dimensional images were evaluated at the independent [employer]
workstation
FINDINGS: Breast composition: c.  Heterogeneous fibroglandular tissue.

Background parenchymal enhancement: Moderate.

Right breast: Confluent nonmasslike enhancement with rapid washout
kinetics is identified throughout the outer right breast from
anterior to posterior thirds and measuring 3.5 x 9 x 3.5 cm
(transverse x AP x CC). Biopsy tract and clip artifact along the
posterior upper-outer aspect of this non mass like enhancement
corresponds to biopsy-proven neoplasm.

Left breast: No mass or abnormal enhancement.

Lymph nodes: No abnormal appearing lymph nodes.

Ancillary findings:  None.
IMPRESSION: 3.5 x 9 x 3.5 cm confluent non mass like enhancement throughout the
outer right breast with biopsy changes posteriorly, compatible with
biopsy-proven neoplasm. Consider sampling of the anterior aspect of
the non mass like enhancement if tissue confirmation is required for
extent of disease.

No evidence of contralateral malignancy or adenopathy.

RECOMMENDATION:
Treatment plan. Consider MR guided biopsy anteriorly if tissue
confirmation is required.

BI-RADS CATEGORY  6: Known biopsy-proven malignancy.

## 2016-11-22 DIAGNOSIS — M47812 Spondylosis without myelopathy or radiculopathy, cervical region: Secondary | ICD-10-CM | POA: Diagnosis not present

## 2016-11-22 DIAGNOSIS — M50322 Other cervical disc degeneration at C5-C6 level: Secondary | ICD-10-CM | POA: Diagnosis not present

## 2017-01-23 ENCOUNTER — Ambulatory Visit (HOSPITAL_BASED_OUTPATIENT_CLINIC_OR_DEPARTMENT_OTHER): Payer: BLUE CROSS/BLUE SHIELD | Admitting: Oncology

## 2017-01-23 ENCOUNTER — Other Ambulatory Visit (HOSPITAL_BASED_OUTPATIENT_CLINIC_OR_DEPARTMENT_OTHER): Payer: BLUE CROSS/BLUE SHIELD

## 2017-01-23 VITALS — BP 122/77 | HR 76 | Temp 98.3°F | Resp 20 | Ht 69.0 in | Wt 160.8 lb

## 2017-01-23 DIAGNOSIS — C50411 Malignant neoplasm of upper-outer quadrant of right female breast: Secondary | ICD-10-CM | POA: Diagnosis not present

## 2017-01-23 DIAGNOSIS — Z853 Personal history of malignant neoplasm of breast: Secondary | ICD-10-CM

## 2017-01-23 DIAGNOSIS — Z86000 Personal history of in-situ neoplasm of breast: Secondary | ICD-10-CM | POA: Diagnosis not present

## 2017-01-23 DIAGNOSIS — Z170 Estrogen receptor positive status [ER+]: Secondary | ICD-10-CM | POA: Insufficient documentation

## 2017-01-23 DIAGNOSIS — D0510 Intraductal carcinoma in situ of unspecified breast: Secondary | ICD-10-CM

## 2017-01-23 LAB — CBC WITH DIFFERENTIAL/PLATELET
BASO%: 0.2 % (ref 0.0–2.0)
BASOS ABS: 0 10*3/uL (ref 0.0–0.1)
EOS ABS: 0.1 10*3/uL (ref 0.0–0.5)
EOS%: 1.6 % (ref 0.0–7.0)
HCT: 35.4 % (ref 34.8–46.6)
HGB: 11.9 g/dL (ref 11.6–15.9)
LYMPH%: 33.8 % (ref 14.0–49.7)
MCH: 31.6 pg (ref 25.1–34.0)
MCHC: 33.6 g/dL (ref 31.5–36.0)
MCV: 93.9 fL (ref 79.5–101.0)
MONO#: 0.3 10*3/uL (ref 0.1–0.9)
MONO%: 7 % (ref 0.0–14.0)
NEUT#: 2.5 10*3/uL (ref 1.5–6.5)
NEUT%: 57.4 % (ref 38.4–76.8)
PLATELETS: 185 10*3/uL (ref 145–400)
RBC: 3.77 10*6/uL (ref 3.70–5.45)
RDW: 11.7 % (ref 11.2–14.5)
WBC: 4.4 10*3/uL (ref 3.9–10.3)
lymph#: 1.5 10*3/uL (ref 0.9–3.3)
nRBC: 0 % (ref 0–0)

## 2017-01-23 LAB — COMPREHENSIVE METABOLIC PANEL
ALBUMIN: 4 g/dL (ref 3.5–5.0)
ALK PHOS: 71 U/L (ref 40–150)
ALT: 14 U/L (ref 0–55)
ANION GAP: 10 meq/L (ref 3–11)
AST: 15 U/L (ref 5–34)
BUN: 11 mg/dL (ref 7.0–26.0)
CO2: 29 meq/L (ref 22–29)
Calcium: 9.5 mg/dL (ref 8.4–10.4)
Chloride: 103 mEq/L (ref 98–109)
Creatinine: 0.9 mg/dL (ref 0.6–1.1)
EGFR: 81 mL/min/{1.73_m2} — AB (ref >90)
GLUCOSE: 104 mg/dL (ref 70–140)
POTASSIUM: 3.8 meq/L (ref 3.5–5.1)
SODIUM: 142 meq/L (ref 136–145)
Total Bilirubin: 0.28 mg/dL (ref 0.20–1.20)
Total Protein: 6.8 g/dL (ref 6.4–8.3)
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
## 2017-01-30 DIAGNOSIS — H5213 Myopia, bilateral: Secondary | ICD-10-CM | POA: Diagnosis not present

## 2017-01-30 DIAGNOSIS — H04123 Dry eye syndrome of bilateral lacrimal glands: Secondary | ICD-10-CM | POA: Diagnosis not present

## 2017-01-31 ENCOUNTER — Other Ambulatory Visit: Payer: Self-pay | Admitting: Internal Medicine

## 2017-01-31 DIAGNOSIS — Z1231 Encounter for screening mammogram for malignant neoplasm of breast: Secondary | ICD-10-CM

## 2017-01-31 DIAGNOSIS — Z9011 Acquired absence of right breast and nipple: Secondary | ICD-10-CM

## 2017-02-13 ENCOUNTER — Other Ambulatory Visit: Payer: Self-pay | Admitting: Internal Medicine

## 2017-02-13 ENCOUNTER — Other Ambulatory Visit: Payer: Self-pay | Admitting: Obstetrics and Gynecology

## 2017-02-13 DIAGNOSIS — N63 Unspecified lump in unspecified breast: Secondary | ICD-10-CM | POA: Diagnosis not present

## 2017-02-13 DIAGNOSIS — Z1231 Encounter for screening mammogram for malignant neoplasm of breast: Secondary | ICD-10-CM

## 2017-02-13 DIAGNOSIS — N6310 Unspecified lump in the right breast, unspecified quadrant: Secondary | ICD-10-CM

## 2017-02-13 DIAGNOSIS — Z9011 Acquired absence of right breast and nipple: Secondary | ICD-10-CM

## 2017-02-14 ENCOUNTER — Ambulatory Visit
Admission: RE | Admit: 2017-02-14 | Discharge: 2017-02-14 | Disposition: A | Discharge status: Home / Self Care | Payer: BLUE CROSS/BLUE SHIELD | Source: Ambulatory Visit | Attending: Internal Medicine | Admitting: Internal Medicine

## 2017-02-14 ENCOUNTER — Ambulatory Visit
Admission: RE | Admit: 2017-02-14 | Discharge: 2017-02-14 | Disposition: A | Discharge status: Home / Self Care | Payer: BLUE CROSS/BLUE SHIELD | Source: Ambulatory Visit | Attending: Obstetrics and Gynecology | Admitting: Obstetrics and Gynecology

## 2017-02-14 ENCOUNTER — Other Ambulatory Visit: Payer: Self-pay | Admitting: Obstetrics and Gynecology

## 2017-02-14 DIAGNOSIS — N6310 Unspecified lump in the right breast, unspecified quadrant: Secondary | ICD-10-CM

## 2017-02-14 DIAGNOSIS — N63 Unspecified lump in unspecified breast: Secondary | ICD-10-CM

## 2017-02-14 DIAGNOSIS — Z9011 Acquired absence of right breast and nipple: Secondary | ICD-10-CM

## 2017-02-14 DIAGNOSIS — R928 Other abnormal and inconclusive findings on diagnostic imaging of breast: Secondary | ICD-10-CM | POA: Diagnosis not present

## 2017-02-14 DIAGNOSIS — N6489 Other specified disorders of breast: Secondary | ICD-10-CM | POA: Diagnosis not present

## 2017-02-14 HISTORY — DX: Personal history of irradiation: Z92.3

## 2017-02-20 ENCOUNTER — Other Ambulatory Visit: Payer: BLUE CROSS/BLUE SHIELD

## 2017-02-23 ENCOUNTER — Ambulatory Visit: Payer: BLUE CROSS/BLUE SHIELD

## 2017-02-26 ENCOUNTER — Ambulatory Visit: Payer: BLUE CROSS/BLUE SHIELD

## 2017-06-15 DIAGNOSIS — Z01419 Encounter for gynecological examination (general) (routine) without abnormal findings: Secondary | ICD-10-CM | POA: Diagnosis not present

## 2017-06-15 DIAGNOSIS — Z6823 Body mass index (BMI) 23.0-23.9, adult: Secondary | ICD-10-CM | POA: Diagnosis not present

## 2017-08-15 ENCOUNTER — Other Ambulatory Visit: Payer: Self-pay | Admitting: *Deleted

## 2017-08-15 MED ORDER — TAMOXIFEN CITRATE 20 MG PO TABS: 20.0000 mg | ORAL_TABLET | Freq: Every day | ORAL | 3 refills | Status: DC

## 2017-08-22 ENCOUNTER — Other Ambulatory Visit: Payer: Self-pay | Admitting: Radiology

## 2017-08-22 DIAGNOSIS — N6310 Unspecified lump in the right breast, unspecified quadrant: Secondary | ICD-10-CM

## 2017-08-22 DIAGNOSIS — Z853 Personal history of malignant neoplasm of breast: Secondary | ICD-10-CM | POA: Diagnosis not present

## 2017-08-23 ENCOUNTER — Ambulatory Visit
Admission: RE | Admit: 2017-08-23 | Discharge: 2017-08-23 | Disposition: A | Discharge status: Home / Self Care | Payer: BLUE CROSS/BLUE SHIELD | Source: Ambulatory Visit | Attending: Radiology | Admitting: Radiology

## 2017-08-23 ENCOUNTER — Other Ambulatory Visit: Payer: Self-pay | Admitting: Radiology

## 2017-08-23 DIAGNOSIS — N6310 Unspecified lump in the right breast, unspecified quadrant: Secondary | ICD-10-CM

## 2017-08-23 DIAGNOSIS — N6489 Other specified disorders of breast: Secondary | ICD-10-CM | POA: Diagnosis not present

## 2017-08-29 ENCOUNTER — Ambulatory Visit
Admission: RE | Admit: 2017-08-29 | Discharge: 2017-08-29 | Disposition: A | Discharge status: Home / Self Care | Payer: BLUE CROSS/BLUE SHIELD | Source: Ambulatory Visit | Attending: Radiology | Admitting: Radiology

## 2017-08-29 ENCOUNTER — Other Ambulatory Visit: Payer: Self-pay | Admitting: Radiology

## 2017-08-29 DIAGNOSIS — N6310 Unspecified lump in the right breast, unspecified quadrant: Secondary | ICD-10-CM

## 2017-08-29 DIAGNOSIS — N6031 Fibrosclerosis of right breast: Secondary | ICD-10-CM | POA: Diagnosis not present

## 2017-08-29 DIAGNOSIS — N6313 Unspecified lump in the right breast, lower outer quadrant: Secondary | ICD-10-CM | POA: Diagnosis not present

## 2017-11-08 DIAGNOSIS — R82998 Other abnormal findings in urine: Secondary | ICD-10-CM | POA: Diagnosis not present

## 2017-11-08 DIAGNOSIS — E7849 Other hyperlipidemia: Secondary | ICD-10-CM | POA: Diagnosis not present

## 2017-11-08 DIAGNOSIS — E538 Deficiency of other specified B group vitamins: Secondary | ICD-10-CM | POA: Diagnosis not present

## 2017-11-08 DIAGNOSIS — Z0000 Encounter for general adult medical examination without abnormal findings: Secondary | ICD-10-CM | POA: Diagnosis not present

## 2017-11-15 DIAGNOSIS — Z0000 Encounter for general adult medical examination without abnormal findings: Secondary | ICD-10-CM | POA: Diagnosis not present

## 2017-11-15 DIAGNOSIS — M79601 Pain in right arm: Secondary | ICD-10-CM | POA: Diagnosis not present

## 2017-11-15 DIAGNOSIS — Z1331 Encounter for screening for depression: Secondary | ICD-10-CM | POA: Diagnosis not present

## 2017-11-15 DIAGNOSIS — E538 Deficiency of other specified B group vitamins: Secondary | ICD-10-CM | POA: Diagnosis not present

## 2017-11-15 DIAGNOSIS — R05 Cough: Secondary | ICD-10-CM | POA: Diagnosis not present

## 2017-11-15 DIAGNOSIS — R3121 Asymptomatic microscopic hematuria: Secondary | ICD-10-CM | POA: Diagnosis not present
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
## 2018-01-31 ENCOUNTER — Inpatient Hospital Stay: Payer: BLUE CROSS/BLUE SHIELD

## 2018-01-31 ENCOUNTER — Inpatient Hospital Stay: Payer: BLUE CROSS/BLUE SHIELD | Attending: Oncology | Admitting: Oncology

## 2018-01-31 ENCOUNTER — Encounter: Payer: Self-pay | Admitting: Oncology

## 2018-01-31 ENCOUNTER — Telehealth: Payer: Self-pay | Admitting: Oncology

## 2018-01-31 VITALS — BP 119/79 | HR 104 | Temp 98.9°F | Resp 18 | Ht 69.0 in | Wt 161.1 lb

## 2018-01-31 DIAGNOSIS — Z800 Family history of malignant neoplasm of digestive organs: Secondary | ICD-10-CM | POA: Diagnosis not present

## 2018-01-31 DIAGNOSIS — Z923 Personal history of irradiation: Secondary | ICD-10-CM | POA: Insufficient documentation

## 2018-01-31 DIAGNOSIS — Z79810 Long term (current) use of selective estrogen receptor modulators (SERMs): Secondary | ICD-10-CM

## 2018-01-31 DIAGNOSIS — R232 Flushing: Secondary | ICD-10-CM | POA: Insufficient documentation

## 2018-01-31 DIAGNOSIS — Z9011 Acquired absence of right breast and nipple: Secondary | ICD-10-CM | POA: Diagnosis not present

## 2018-01-31 DIAGNOSIS — C50411 Malignant neoplasm of upper-outer quadrant of right female breast: Secondary | ICD-10-CM

## 2018-01-31 DIAGNOSIS — D0510 Intraductal carcinoma in situ of unspecified breast: Secondary | ICD-10-CM

## 2018-01-31 DIAGNOSIS — D0511 Intraductal carcinoma in situ of right breast: Secondary | ICD-10-CM | POA: Diagnosis not present

## 2018-01-31 DIAGNOSIS — Z170 Estrogen receptor positive status [ER+]: Secondary | ICD-10-CM

## 2018-01-31 DIAGNOSIS — Z79899 Other long term (current) drug therapy: Secondary | ICD-10-CM | POA: Diagnosis not present

## 2018-01-31 LAB — CBC WITH DIFFERENTIAL/PLATELET
BASOS ABS: 0 10*3/uL (ref 0.0–0.1)
Basophils Relative: 1 %
EOS PCT: 1 %
Eosinophils Absolute: 0 10*3/uL (ref 0.0–0.5)
HEMATOCRIT: 35.4 % (ref 34.8–46.6)
Hemoglobin: 11.9 g/dL (ref 11.6–15.9)
LYMPHS ABS: 1.4 10*3/uL (ref 0.9–3.3)
LYMPHS PCT: 32 %
MCH: 31.5 pg (ref 25.1–34.0)
MCHC: 33.5 g/dL (ref 31.5–36.0)
MCV: 94 fL (ref 79.5–101.0)
MONO ABS: 0.2 10*3/uL (ref 0.1–0.9)
MONOS PCT: 5 %
NEUTROS ABS: 2.7 10*3/uL (ref 1.5–6.5)
Neutrophils Relative %: 61 %
PLATELETS: 187 10*3/uL (ref 145–400)
RBC: 3.77 MIL/uL (ref 3.70–5.45)
RDW: 12.1 % (ref 11.2–14.5)
WBC: 4.4 10*3/uL (ref 3.9–10.3)

## 2018-01-31 LAB — COMPREHENSIVE METABOLIC PANEL
ALBUMIN: 4.1 g/dL (ref 3.5–5.0)
ALT: 13 U/L (ref 0–55)
ANION GAP: 6 (ref 3–11)
AST: 17 U/L (ref 5–34)
Alkaline Phosphatase: 71 U/L (ref 40–150)
BILIRUBIN TOTAL: 0.3 mg/dL (ref 0.2–1.2)
BUN: 15 mg/dL (ref 7–26)
CO2: 31 mmol/L — AB (ref 22–29)
Calcium: 9.2 mg/dL (ref 8.4–10.4)
Chloride: 104 mmol/L (ref 98–109)
Creatinine, Ser: 1.06 mg/dL (ref 0.60–1.10)
GFR calc Af Amer: >60 mL/min (ref >60)
GFR calc non Af Amer: >60 mL/min (ref >60)
GLUCOSE: 128 mg/dL (ref 70–140)
POTASSIUM: 3.7 mmol/L (ref 3.5–5.1)
Sodium: 141 mmol/L (ref 136–145)
TOTAL PROTEIN: 6.8 g/dL (ref 6.4–8.3)

## 2018-01-31 NOTE — Telephone Encounter (Signed)
Gave patient AVs and calendar of upcoming June 2020 appointments.

## 2018-04-02 ENCOUNTER — Other Ambulatory Visit: Payer: Self-pay | Admitting: Obstetrics and Gynecology

## 2018-04-02 DIAGNOSIS — Z1231 Encounter for screening mammogram for malignant neoplasm of breast: Secondary | ICD-10-CM

## 2018-04-15 DIAGNOSIS — J069 Acute upper respiratory infection, unspecified: Secondary | ICD-10-CM | POA: Diagnosis not present

## 2018-04-24 ENCOUNTER — Ambulatory Visit: Payer: BLUE CROSS/BLUE SHIELD

## 2018-05-22 ENCOUNTER — Ambulatory Visit
Admission: RE | Admit: 2018-05-22 | Discharge: 2018-05-22 | Disposition: A | Discharge status: Home / Self Care | Payer: BLUE CROSS/BLUE SHIELD | Source: Ambulatory Visit | Attending: Obstetrics and Gynecology | Admitting: Obstetrics and Gynecology

## 2018-05-22 DIAGNOSIS — Z1231 Encounter for screening mammogram for malignant neoplasm of breast: Secondary | ICD-10-CM | POA: Diagnosis not present

## 2018-06-10 DIAGNOSIS — Z923 Personal history of irradiation: Secondary | ICD-10-CM | POA: Diagnosis not present

## 2018-06-10 DIAGNOSIS — Z853 Personal history of malignant neoplasm of breast: Secondary | ICD-10-CM | POA: Diagnosis not present

## 2018-06-10 DIAGNOSIS — Z9011 Acquired absence of right breast and nipple: Secondary | ICD-10-CM | POA: Diagnosis not present

## 2018-07-02 DIAGNOSIS — H04123 Dry eye syndrome of bilateral lacrimal glands: Secondary | ICD-10-CM | POA: Diagnosis not present

## 2018-07-02 DIAGNOSIS — Z79899 Other long term (current) drug therapy: Secondary | ICD-10-CM | POA: Diagnosis not present

## 2018-07-02 DIAGNOSIS — H5213 Myopia, bilateral: Secondary | ICD-10-CM | POA: Diagnosis not present

## 2018-07-25 DIAGNOSIS — Z01419 Encounter for gynecological examination (general) (routine) without abnormal findings: Secondary | ICD-10-CM | POA: Diagnosis not present

## 2018-07-25 DIAGNOSIS — Z6824 Body mass index (BMI) 24.0-24.9, adult: Secondary | ICD-10-CM | POA: Diagnosis not present

## 2018-09-26 ENCOUNTER — Other Ambulatory Visit: Payer: Self-pay | Admitting: Oncology

## 2019-03-05 ENCOUNTER — Other Ambulatory Visit: Payer: Self-pay

## 2019-03-05 DIAGNOSIS — C50411 Malignant neoplasm of upper-outer quadrant of right female breast: Secondary | ICD-10-CM

## 2019-03-06 ENCOUNTER — Inpatient Hospital Stay: Payer: BC Managed Care – PPO

## 2019-03-06 ENCOUNTER — Inpatient Hospital Stay: Payer: BC Managed Care – PPO | Admitting: Oncology
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
## 2019-03-12 ENCOUNTER — Inpatient Hospital Stay (HOSPITAL_BASED_OUTPATIENT_CLINIC_OR_DEPARTMENT_OTHER): Payer: BC Managed Care – PPO | Admitting: Oncology

## 2019-03-12 ENCOUNTER — Inpatient Hospital Stay: Payer: BC Managed Care – PPO | Attending: Oncology

## 2019-03-12 ENCOUNTER — Other Ambulatory Visit: Payer: Self-pay

## 2019-03-12 VITALS — BP 123/69 | HR 81 | Temp 98.7°F | Resp 20 | Wt 161.7 lb

## 2019-03-12 DIAGNOSIS — Z791 Long term (current) use of non-steroidal anti-inflammatories (NSAID): Secondary | ICD-10-CM | POA: Insufficient documentation

## 2019-03-12 DIAGNOSIS — Z923 Personal history of irradiation: Secondary | ICD-10-CM | POA: Insufficient documentation

## 2019-03-12 DIAGNOSIS — Z79810 Long term (current) use of selective estrogen receptor modulators (SERMs): Secondary | ICD-10-CM | POA: Insufficient documentation

## 2019-03-12 DIAGNOSIS — Z79899 Other long term (current) drug therapy: Secondary | ICD-10-CM | POA: Insufficient documentation

## 2019-03-12 DIAGNOSIS — C50411 Malignant neoplasm of upper-outer quadrant of right female breast: Secondary | ICD-10-CM

## 2019-03-12 DIAGNOSIS — Z800 Family history of malignant neoplasm of digestive organs: Secondary | ICD-10-CM | POA: Diagnosis not present

## 2019-03-12 DIAGNOSIS — Z170 Estrogen receptor positive status [ER+]: Secondary | ICD-10-CM | POA: Insufficient documentation

## 2019-03-12 DIAGNOSIS — D0511 Intraductal carcinoma in situ of right breast: Secondary | ICD-10-CM

## 2019-03-12 DIAGNOSIS — Z9011 Acquired absence of right breast and nipple: Secondary | ICD-10-CM | POA: Insufficient documentation

## 2019-03-12 DIAGNOSIS — D0510 Intraductal carcinoma in situ of unspecified breast: Secondary | ICD-10-CM

## 2019-03-12 LAB — CMP (CANCER CENTER ONLY)
ALT: 12 U/L (ref 0–44)
AST: 15 U/L (ref 15–41)
Albumin: 4.1 g/dL (ref 3.5–5.0)
Alkaline Phosphatase: 74 U/L (ref 38–126)
Anion gap: 10 (ref 5–15)
BUN: 12 mg/dL (ref 6–20)
CO2: 28 mmol/L (ref 22–32)
Calcium: 9.2 mg/dL (ref 8.9–10.3)
Chloride: 103 mmol/L (ref 98–111)
Creatinine: 1.08 mg/dL — ABNORMAL HIGH (ref 0.44–1.00)
GFR, Est AFR Am: >60 mL/min (ref >60)
GFR, Estimated: >60 mL/min (ref >60)
Glucose, Bld: 137 mg/dL — ABNORMAL HIGH (ref 70–99)
Potassium: 3.6 mmol/L (ref 3.5–5.1)
Sodium: 141 mmol/L (ref 135–145)
Total Bilirubin: 0.3 mg/dL (ref 0.3–1.2)
Total Protein: 6.8 g/dL (ref 6.5–8.1)

## 2019-03-12 LAB — CBC WITH DIFFERENTIAL (CANCER CENTER ONLY)
Abs Immature Granulocytes: 0.01 10*3/uL (ref 0.00–0.07)
Basophils Absolute: 0 10*3/uL (ref 0.0–0.1)
Basophils Relative: 1 %
Eosinophils Absolute: 0.1 10*3/uL (ref 0.0–0.5)
Eosinophils Relative: 2 %
HCT: 35.2 % — ABNORMAL LOW (ref 36.0–46.0)
Hemoglobin: 11.8 g/dL — ABNORMAL LOW (ref 12.0–15.0)
Immature Granulocytes: 0 %
Lymphocytes Relative: 44 %
Lymphs Abs: 1.9 10*3/uL (ref 0.7–4.0)
MCH: 31.3 pg (ref 26.0–34.0)
MCHC: 33.5 g/dL (ref 30.0–36.0)
MCV: 93.4 fL (ref 80.0–100.0)
Monocytes Absolute: 0.3 10*3/uL (ref 0.1–1.0)
Monocytes Relative: 6 %
Neutro Abs: 2.1 10*3/uL (ref 1.7–7.7)
Neutrophils Relative %: 47 %
Platelet Count: 185 10*3/uL (ref 150–400)
RBC: 3.77 MIL/uL — ABNORMAL LOW (ref 3.87–5.11)
RDW: 11.3 % — ABNORMAL LOW (ref 11.5–15.5)
WBC Count: 4.4 10*3/uL (ref 4.0–10.5)
nRBC: 0 % (ref 0.0–0.2)

## 2019-03-12 MED ORDER — TAMOXIFEN CITRATE 20 MG PO TABS: 20.0000 mg | ORAL_TABLET | Freq: Every day | ORAL | 4 refills | Status: DC

## 2019-03-13 ENCOUNTER — Telehealth: Payer: Self-pay | Admitting: Oncology

## 2019-03-13 NOTE — Telephone Encounter (Signed)
I talk with patient regarding schedule  

## 2019-04-16 DIAGNOSIS — M79674 Pain in right toe(s): Secondary | ICD-10-CM | POA: Diagnosis not present

## 2019-04-16 DIAGNOSIS — M79675 Pain in left toe(s): Secondary | ICD-10-CM | POA: Diagnosis not present

## 2019-04-16 DIAGNOSIS — L600 Ingrowing nail: Secondary | ICD-10-CM | POA: Diagnosis not present

## 2019-04-16 DIAGNOSIS — B351 Tinea unguium: Secondary | ICD-10-CM | POA: Diagnosis not present

## 2019-04-18 ENCOUNTER — Other Ambulatory Visit: Payer: Self-pay | Admitting: Obstetrics and Gynecology

## 2019-04-18 DIAGNOSIS — Z1231 Encounter for screening mammogram for malignant neoplasm of breast: Secondary | ICD-10-CM

## 2019-05-28 DIAGNOSIS — B351 Tinea unguium: Secondary | ICD-10-CM | POA: Diagnosis not present

## 2019-05-28 DIAGNOSIS — M79675 Pain in left toe(s): Secondary | ICD-10-CM | POA: Diagnosis not present

## 2019-05-28 DIAGNOSIS — L600 Ingrowing nail: Secondary | ICD-10-CM | POA: Diagnosis not present

## 2019-05-28 DIAGNOSIS — M79674 Pain in right toe(s): Secondary | ICD-10-CM | POA: Diagnosis not present

## 2019-05-28 DIAGNOSIS — M25774 Osteophyte, right foot: Secondary | ICD-10-CM | POA: Diagnosis not present

## 2019-06-02 ENCOUNTER — Other Ambulatory Visit: Payer: Self-pay

## 2019-06-02 ENCOUNTER — Ambulatory Visit
Admission: RE | Admit: 2019-06-02 | Discharge: 2019-06-02 | Disposition: A | Discharge status: Home / Self Care | Payer: BC Managed Care – PPO | Source: Ambulatory Visit | Attending: Obstetrics and Gynecology | Admitting: Obstetrics and Gynecology

## 2019-06-02 DIAGNOSIS — Z1231 Encounter for screening mammogram for malignant neoplasm of breast: Secondary | ICD-10-CM

## 2019-06-06 ENCOUNTER — Telehealth: Payer: Self-pay | Admitting: *Deleted

## 2019-06-06 NOTE — Telephone Encounter (Addendum)
Pt left VM stating she has been prescribed Lamisil for toe nail issue but wanted to verify with Dr Jana Hakim ok to take with current tamoxifen therapy.  Return call number given as [phone].  Above will be reviewed with MD and call returned to pt.  Per MD review - ok to take Lamisil. This RN called pt and informed her.

## 2019-06-11 DIAGNOSIS — Z923 Personal history of irradiation: Secondary | ICD-10-CM | POA: Diagnosis not present

## 2019-06-11 DIAGNOSIS — Z9011 Acquired absence of right breast and nipple: Secondary | ICD-10-CM | POA: Diagnosis not present

## 2019-06-11 DIAGNOSIS — Z853 Personal history of malignant neoplasm of breast: Secondary | ICD-10-CM | POA: Diagnosis not present

## 2019-06-25 DIAGNOSIS — L600 Ingrowing nail: Secondary | ICD-10-CM | POA: Diagnosis not present

## 2019-06-25 DIAGNOSIS — M79674 Pain in right toe(s): Secondary | ICD-10-CM | POA: Diagnosis not present

## 2019-06-25 DIAGNOSIS — M79675 Pain in left toe(s): Secondary | ICD-10-CM | POA: Diagnosis not present

## 2019-06-25 DIAGNOSIS — B351 Tinea unguium: Secondary | ICD-10-CM | POA: Diagnosis not present

## 2019-08-11 DIAGNOSIS — H04123 Dry eye syndrome of bilateral lacrimal glands: Secondary | ICD-10-CM | POA: Diagnosis not present

## 2019-08-11 DIAGNOSIS — H5213 Myopia, bilateral: Secondary | ICD-10-CM | POA: Diagnosis not present

## 2020-01-05 ENCOUNTER — Telehealth: Payer: Self-pay | Admitting: Oncology

## 2020-01-05 NOTE — Telephone Encounter (Signed)
Rescheduled appts per provider PAL. Pt confirmed new appt date and time.  

## 2020-03-11 ENCOUNTER — Other Ambulatory Visit: Payer: BC Managed Care – PPO

## 2020-03-11 ENCOUNTER — Ambulatory Visit: Payer: BC Managed Care – PPO | Admitting: Oncology

## 2020-03-12 ENCOUNTER — Other Ambulatory Visit: Payer: Self-pay | Admitting: *Deleted

## 2020-03-12 DIAGNOSIS — C50411 Malignant neoplasm of upper-outer quadrant of right female breast: Secondary | ICD-10-CM

## 2020-03-12 DIAGNOSIS — E538 Deficiency of other specified B group vitamins: Secondary | ICD-10-CM

## 2020-03-17 ENCOUNTER — Inpatient Hospital Stay (HOSPITAL_BASED_OUTPATIENT_CLINIC_OR_DEPARTMENT_OTHER): Payer: 59 | Admitting: Oncology

## 2020-03-17 ENCOUNTER — Other Ambulatory Visit: Payer: Self-pay

## 2020-03-17 ENCOUNTER — Inpatient Hospital Stay: Payer: 59 | Attending: Oncology

## 2020-03-17 VITALS — BP 108/73 | HR 78 | Temp 98.2°F | Resp 20 | Ht 69.0 in | Wt 162.3 lb

## 2020-03-17 DIAGNOSIS — Z923 Personal history of irradiation: Secondary | ICD-10-CM | POA: Insufficient documentation

## 2020-03-17 DIAGNOSIS — C50411 Malignant neoplasm of upper-outer quadrant of right female breast: Secondary | ICD-10-CM

## 2020-03-17 DIAGNOSIS — R5383 Other fatigue: Secondary | ICD-10-CM | POA: Diagnosis not present

## 2020-03-17 DIAGNOSIS — Z9011 Acquired absence of right breast and nipple: Secondary | ICD-10-CM | POA: Diagnosis not present

## 2020-03-17 DIAGNOSIS — D0510 Intraductal carcinoma in situ of unspecified breast: Secondary | ICD-10-CM

## 2020-03-17 DIAGNOSIS — D0511 Intraductal carcinoma in situ of right breast: Secondary | ICD-10-CM | POA: Diagnosis not present

## 2020-03-17 DIAGNOSIS — Z170 Estrogen receptor positive status [ER+]: Secondary | ICD-10-CM | POA: Diagnosis not present

## 2020-03-17 DIAGNOSIS — E538 Deficiency of other specified B group vitamins: Secondary | ICD-10-CM

## 2020-03-17 LAB — CMP (CANCER CENTER ONLY)
ALT: 16 U/L (ref 0–44)
AST: 16 U/L (ref 15–41)
Albumin: 4 g/dL (ref 3.5–5.0)
Alkaline Phosphatase: 54 U/L (ref 38–126)
Anion gap: 7 (ref 5–15)
BUN: 22 mg/dL — ABNORMAL HIGH (ref 6–20)
CO2: 27 mmol/L (ref 22–32)
Calcium: 9.3 mg/dL (ref 8.9–10.3)
Chloride: 106 mmol/L (ref 98–111)
Creatinine: 1.19 mg/dL — ABNORMAL HIGH (ref 0.44–1.00)
GFR, Est AFR Am: >60 mL/min
GFR, Estimated: 55 mL/min — ABNORMAL LOW
Glucose, Bld: 96 mg/dL (ref 70–99)
Potassium: 4.1 mmol/L (ref 3.5–5.1)
Sodium: 140 mmol/L (ref 135–145)
Total Bilirubin: 0.3 mg/dL (ref 0.3–1.2)
Total Protein: 6.4 g/dL — ABNORMAL LOW (ref 6.5–8.1)

## 2020-03-17 LAB — CBC WITH DIFFERENTIAL (CANCER CENTER ONLY)
Abs Immature Granulocytes: 0.02 10*3/uL (ref 0.00–0.07)
Basophils Absolute: 0 10*3/uL (ref 0.0–0.1)
Basophils Relative: 1 %
Eosinophils Absolute: 0 10*3/uL (ref 0.0–0.5)
Eosinophils Relative: 1 %
HCT: 35 % — ABNORMAL LOW (ref 36.0–46.0)
Hemoglobin: 11.9 g/dL — ABNORMAL LOW (ref 12.0–15.0)
Immature Granulocytes: 0 %
Lymphocytes Relative: 36 %
Lymphs Abs: 1.9 10*3/uL (ref 0.7–4.0)
MCH: 32.2 pg (ref 26.0–34.0)
MCHC: 34 g/dL (ref 30.0–36.0)
MCV: 94.6 fL (ref 80.0–100.0)
Monocytes Absolute: 0.3 10*3/uL (ref 0.1–1.0)
Monocytes Relative: 5 %
Neutro Abs: 2.9 10*3/uL (ref 1.7–7.7)
Neutrophils Relative %: 57 %
Platelet Count: 198 10*3/uL (ref 150–400)
RBC: 3.7 MIL/uL — ABNORMAL LOW (ref 3.87–5.11)
RDW: 11.9 % (ref 11.5–15.5)
WBC Count: 5.1 10*3/uL (ref 4.0–10.5)
nRBC: 0 % (ref 0.0–0.2)

## 2020-03-17 LAB — VITAMIN B12: Vitamin B-12: 394 pg/mL (ref 180–914)
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
## 2020-03-19 ENCOUNTER — Telehealth: Payer: Self-pay | Admitting: Oncology

## 2020-03-19 NOTE — Telephone Encounter (Signed)
Scheduled appts per 7/7 los. Pt confirmed appt date and time.
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
## 2020-05-24 ENCOUNTER — Inpatient Hospital Stay: Payer: 59 | Attending: Oncology | Admitting: Oncology

## 2020-05-24 DIAGNOSIS — C50411 Malignant neoplasm of upper-outer quadrant of right female breast: Secondary | ICD-10-CM

## 2020-05-24 DIAGNOSIS — Z170 Estrogen receptor positive status [ER+]: Secondary | ICD-10-CM

## 2020-05-25 ENCOUNTER — Telehealth: Payer: Self-pay | Admitting: Oncology

## 2020-05-25 NOTE — Telephone Encounter (Signed)
Scheduled per 9/13 los. Called and left a msg. Mailing appt letter and calendar printout

## 2020-06-14 ENCOUNTER — Other Ambulatory Visit: Payer: Self-pay | Admitting: Oncology

## 2020-08-23 ENCOUNTER — Other Ambulatory Visit: Payer: Self-pay | Admitting: Obstetrics and Gynecology

## 2020-08-23 DIAGNOSIS — Z1231 Encounter for screening mammogram for malignant neoplasm of breast: Secondary | ICD-10-CM

## 2020-10-04 ENCOUNTER — Other Ambulatory Visit: Payer: Self-pay

## 2020-10-04 ENCOUNTER — Ambulatory Visit
Admission: RE | Admit: 2020-10-04 | Discharge: 2020-10-04 | Disposition: A | Discharge status: Home / Self Care | Payer: 59 | Source: Ambulatory Visit | Attending: Obstetrics and Gynecology | Admitting: Obstetrics and Gynecology

## 2020-10-04 DIAGNOSIS — Z1231 Encounter for screening mammogram for malignant neoplasm of breast: Secondary | ICD-10-CM

## 2020-12-21 ENCOUNTER — Other Ambulatory Visit: Payer: Self-pay

## 2020-12-21 DIAGNOSIS — C50411 Malignant neoplasm of upper-outer quadrant of right female breast: Secondary | ICD-10-CM

## 2020-12-21 DIAGNOSIS — Z170 Estrogen receptor positive status [ER+]: Secondary | ICD-10-CM
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
## 2020-12-22 ENCOUNTER — Inpatient Hospital Stay: Payer: 59 | Attending: Oncology | Admitting: Oncology

## 2020-12-22 ENCOUNTER — Other Ambulatory Visit: Payer: Self-pay

## 2020-12-22 ENCOUNTER — Inpatient Hospital Stay: Payer: 59

## 2020-12-22 VITALS — BP 130/90 | HR 89 | Temp 98.2°F | Resp 18 | Ht 69.0 in | Wt 148.5 lb

## 2020-12-22 DIAGNOSIS — Z170 Estrogen receptor positive status [ER+]: Secondary | ICD-10-CM

## 2020-12-22 DIAGNOSIS — C50411 Malignant neoplasm of upper-outer quadrant of right female breast: Secondary | ICD-10-CM | POA: Diagnosis not present

## 2020-12-22 DIAGNOSIS — Z79810 Long term (current) use of selective estrogen receptor modulators (SERMs): Secondary | ICD-10-CM | POA: Diagnosis not present

## 2020-12-22 DIAGNOSIS — D0511 Intraductal carcinoma in situ of right breast: Secondary | ICD-10-CM | POA: Diagnosis not present

## 2020-12-22 DIAGNOSIS — Z923 Personal history of irradiation: Secondary | ICD-10-CM | POA: Diagnosis not present

## 2020-12-22 LAB — CBC WITH DIFFERENTIAL (CANCER CENTER ONLY)
Abs Immature Granulocytes: 0.01 10*3/uL (ref 0.00–0.07)
Basophils Absolute: 0.1 10*3/uL (ref 0.0–0.1)
Basophils Relative: 1 %
Eosinophils Absolute: 0 10*3/uL (ref 0.0–0.5)
Eosinophils Relative: 1 %
HCT: 36.6 % (ref 36.0–46.0)
Hemoglobin: 12.3 g/dL (ref 12.0–15.0)
Immature Granulocytes: 0 %
Lymphocytes Relative: 39 %
Lymphs Abs: 1.8 10*3/uL (ref 0.7–4.0)
MCH: 31.8 pg (ref 26.0–34.0)
MCHC: 33.6 g/dL (ref 30.0–36.0)
MCV: 94.6 fL (ref 80.0–100.0)
Monocytes Absolute: 0.2 10*3/uL (ref 0.1–1.0)
Monocytes Relative: 5 %
Neutro Abs: 2.6 10*3/uL (ref 1.7–7.7)
Neutrophils Relative %: 54 %
Platelet Count: 188 10*3/uL (ref 150–400)
RBC: 3.87 MIL/uL (ref 3.87–5.11)
RDW: 11.9 % (ref 11.5–15.5)
WBC Count: 4.7 10*3/uL (ref 4.0–10.5)
nRBC: 0 % (ref 0.0–0.2)

## 2020-12-22 LAB — CMP (CANCER CENTER ONLY)
ALT: 12 U/L (ref 0–44)
AST: 16 U/L (ref 15–41)
Albumin: 4.2 g/dL (ref 3.5–5.0)
Alkaline Phosphatase: 69 U/L (ref 38–126)
Anion gap: 11 (ref 5–15)
BUN: 14 mg/dL (ref 6–20)
CO2: 30 mmol/L (ref 22–32)
Calcium: 9.3 mg/dL (ref 8.9–10.3)
Chloride: 102 mmol/L (ref 98–111)
Creatinine: 0.96 mg/dL (ref 0.44–1.00)
GFR, Estimated: >60 mL/min (ref >60)
Glucose, Bld: 114 mg/dL — ABNORMAL HIGH (ref 70–99)
Potassium: 3.9 mmol/L (ref 3.5–5.1)
Sodium: 143 mmol/L (ref 135–145)
Total Bilirubin: 0.3 mg/dL (ref 0.3–1.2)
Total Protein: 6.8 g/dL (ref 6.5–8.1)

## 2020-12-22 MED ORDER — TAMOXIFEN CITRATE 20 MG PO TABS
20.0000 mg | ORAL_TABLET | Freq: Every day | ORAL | 4 refills | Status: DC
Start: 2020-12-22 — End: 2021-08-08

## 2021-01-12 ENCOUNTER — Telehealth: Payer: Self-pay | Admitting: Oncology

## 2021-01-12 NOTE — Telephone Encounter (Signed)
Scheduled per 4/13 los. Called and spoke with pt confirmed 4/13 appts

## 2021-06-10 ENCOUNTER — Encounter: Payer: Self-pay | Admitting: Podiatrist

## 2021-06-10 ENCOUNTER — Other Ambulatory Visit: Payer: Self-pay

## 2021-06-10 ENCOUNTER — Ambulatory Visit (INDEPENDENT_AMBULATORY_CARE_PROVIDER_SITE_OTHER): Payer: 59 | Admitting: Podiatrist

## 2021-06-10 DIAGNOSIS — L600 Ingrowing nail: Secondary | ICD-10-CM | POA: Diagnosis not present

## 2021-06-10 DIAGNOSIS — L603 Nail dystrophy: Secondary | ICD-10-CM

## 2021-06-10 NOTE — Progress Notes (Signed)
HPI: Patient is 45 y.o. female who presents today for an ingrown toenail on the lateral nail border of the right great toenail for about a weeks duration.  She states that its been draining but she is noticed no bleeding.  She states that she clipped her nail too short and the nail became ingrown.  She also states she has yellowish discoloration on the parts of the nail and utilized Jublia in the past of which she states helped however the yellowing of the nail returned.  Patient Active Problem List   Diagnosis Date Noted   Malignant neoplasm of upper-outer quadrant of right breast in female, estrogen receptor positive ([place]) 01/23/2017   Genetic testing 12/24/2015   History of breast cancer 11/23/2015   DCIS (ductal carcinoma in situ) of breast 12/21/2014   Family history of malignant neoplasm of gastrointestinal tract 11/26/2014   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 ([place]) 11/23/2014   Migraines 11/23/2014    Current Outpatient Medications on File Prior to Visit  Medication Sig Dispense Refill   cetirizine (ZYRTEC) 10 MG tablet Take 10 mg by mouth daily as needed for allergies.     ibuprofen (ADVIL,MOTRIN) 200 MG tablet Take 200 mg by mouth every 6 (six) hours as needed.     Multiple Vitamins-Minerals (MULTIVITAMIN WITH MINERALS) tablet Take 1 tablet by mouth daily.     tamoxifen (NOLVADEX) 20 MG tablet Take 1 tablet (20 mg total) by mouth daily. 90 tablet 4   No current facility-administered medications on file prior to visit.    Allergies  Allergen Reactions   Other Rash    Steri-strips    Review of Systems No fevers, chills, nausea, muscle aches, no difficulty breathing, no calf pain, no chest pain or shortness of breath.   Physical Exam  GENERAL APPEARANCE: Alert, conversant. Appropriately groomed. No acute distress.   VASCULAR: Pedal pulses palpable DP and PT bilateral.  Capillary refill time is immediate to all digits,  Proximal to distal cooling it  warm to warm.  Digital perfusion adequate.   NEUROLOGIC: sensation is intact to 5.07 monofilament at 5/5 sites bilateral.  Light touch is intact bilateral, vibratory sensation intact bilateral  MUSCULOSKELETAL: acceptable muscle strength, tone and stability bilateral.  No gross boney pedal deformities noted.  No pain, crepitus or limitation noted with foot and ankle range of motion bilateral.   DERMATOLOGIC: skin is warm, supple, and dry.  Right hallux lateral nail border is incurvated and ingrown.  Pain with direct pressure is noted.  Irritation along the lateral nail border is also seen.  A small amount of pus is expressed at the distal lateral tip.  Yellowish discoloration is present on the medial and lateral aspect of the nail at the distal third of the nail itself.  Assessment   1. Ingrown nail   2. Nail dystrophy      Plan  Discussed treatment options and alternatives with the patient.  Recommended a permanent removal of the lateral nail border of the right hallux nail and will send the specimen for culture to see if there is fungus present. The patient agreed I prepped the skin with alcohol infiltrated lidocaine Marcaine mixture in a digital block fashion.  The toe was then prepped with Betadine and exsanguinated and the lateral nail border was then sharply excised from distal to proximal exposing the nail matrix tissue.  The matrix tissue was treated with phenol followed by an alcohol wash and Silvadene cream and a dry sterile  compressive dressing was applied.  Patient was given instructions for soaks and aftercare.  I will call with the result of the culture when it becomes available.  We can decide on treatment options at that time.

## 2021-06-10 NOTE — Patient Instructions (Signed)

## 2021-06-17 ENCOUNTER — Encounter: Payer: Self-pay | Admitting: Podiatrist

## 2021-07-25 ENCOUNTER — Other Ambulatory Visit: Payer: Self-pay | Admitting: Obstetrics and Gynecology

## 2021-07-25 DIAGNOSIS — N644 Mastodynia: Secondary | ICD-10-CM

## 2021-08-06 ENCOUNTER — Other Ambulatory Visit: Payer: Self-pay | Admitting: Oncology

## 2021-08-09 ENCOUNTER — Ambulatory Visit
Admission: RE | Admit: 2021-08-09 | Discharge: 2021-08-09 | Disposition: A | Discharge status: Home / Self Care | Payer: 59 | Source: Ambulatory Visit | Attending: Obstetrics and Gynecology | Admitting: Obstetrics and Gynecology

## 2021-08-09 ENCOUNTER — Other Ambulatory Visit: Payer: Self-pay

## 2021-08-09 DIAGNOSIS — N644 Mastodynia: Secondary | ICD-10-CM

## 2021-12-22 ENCOUNTER — Other Ambulatory Visit: Payer: 59

## 2021-12-22 ENCOUNTER — Ambulatory Visit: Payer: 59 | Admitting: Hematology and Oncology

## 2021-12-22 ENCOUNTER — Ambulatory Visit: Payer: Self-pay | Admitting: Hematology and Oncology

## 2021-12-24 NOTE — Progress Notes (Signed)
This encounter was created in error - please disregard.

## 2021-12-30 ENCOUNTER — Other Ambulatory Visit: Payer: Self-pay

## 2021-12-30 DIAGNOSIS — C50411 Malignant neoplasm of upper-outer quadrant of right female breast: Secondary | ICD-10-CM

## 2021-12-30 NOTE — Progress Notes (Signed)
Lab orders placed.  

## 2022-01-03 ENCOUNTER — Inpatient Hospital Stay: Payer: Managed Care, Other (non HMO) | Attending: Hematology and Oncology

## 2022-01-03 ENCOUNTER — Encounter: Payer: Self-pay | Admitting: Hematology and Oncology

## 2022-01-03 ENCOUNTER — Inpatient Hospital Stay (HOSPITAL_BASED_OUTPATIENT_CLINIC_OR_DEPARTMENT_OTHER): Payer: Managed Care, Other (non HMO) | Admitting: Hematology and Oncology

## 2022-01-03 ENCOUNTER — Other Ambulatory Visit: Payer: Self-pay

## 2022-01-03 VITALS — BP 145/91 | HR 82 | Temp 97.7°F | Resp 16 | Ht 69.0 in | Wt 163.5 lb

## 2022-01-03 DIAGNOSIS — Z808 Family history of malignant neoplasm of other organs or systems: Secondary | ICD-10-CM | POA: Diagnosis not present

## 2022-01-03 DIAGNOSIS — C50411 Malignant neoplasm of upper-outer quadrant of right female breast: Secondary | ICD-10-CM | POA: Diagnosis not present

## 2022-01-03 DIAGNOSIS — Z853 Personal history of malignant neoplasm of breast: Secondary | ICD-10-CM | POA: Diagnosis present

## 2022-01-03 DIAGNOSIS — Z800 Family history of malignant neoplasm of digestive organs: Secondary | ICD-10-CM

## 2022-01-03 DIAGNOSIS — Z170 Estrogen receptor positive status [ER+]: Secondary | ICD-10-CM | POA: Diagnosis not present

## 2022-01-03 LAB — CBC WITH DIFFERENTIAL (CANCER CENTER ONLY)
Abs Immature Granulocytes: 0.02 10*3/uL (ref 0.00–0.07)
Basophils Absolute: 0.1 10*3/uL (ref 0.0–0.1)
Basophils Relative: 1 %
Eosinophils Absolute: 0.1 10*3/uL (ref 0.0–0.5)
Eosinophils Relative: 2 %
HCT: 38.1 % (ref 36.0–46.0)
Hemoglobin: 12.9 g/dL (ref 12.0–15.0)
Immature Granulocytes: 0 %
Lymphocytes Relative: 32 %
Lymphs Abs: 1.7 10*3/uL (ref 0.7–4.0)
MCH: 31.8 pg (ref 26.0–34.0)
MCHC: 33.9 g/dL (ref 30.0–36.0)
MCV: 93.8 fL (ref 80.0–100.0)
Monocytes Absolute: 0.3 10*3/uL (ref 0.1–1.0)
Monocytes Relative: 6 %
Neutro Abs: 3.1 10*3/uL (ref 1.7–7.7)
Neutrophils Relative %: 59 %
Platelet Count: 192 10*3/uL (ref 150–400)
RBC: 4.06 MIL/uL (ref 3.87–5.11)
RDW: 12.3 % (ref 11.5–15.5)
WBC Count: 5.2 10*3/uL (ref 4.0–10.5)
nRBC: 0 % (ref 0.0–0.2)

## 2022-01-03 LAB — CMP (CANCER CENTER ONLY)
ALT: 14 U/L (ref 0–44)
AST: 18 U/L (ref 15–41)
Albumin: 4.2 g/dL (ref 3.5–5.0)
Alkaline Phosphatase: 57 U/L (ref 38–126)
Anion gap: 6 (ref 5–15)
BUN: 12 mg/dL (ref 6–20)
CO2: 30 mmol/L (ref 22–32)
Calcium: 9.1 mg/dL (ref 8.9–10.3)
Chloride: 106 mmol/L (ref 98–111)
Creatinine: 0.99 mg/dL (ref 0.44–1.00)
GFR, Estimated: >60 mL/min (ref >60)
Glucose, Bld: 113 mg/dL — ABNORMAL HIGH (ref 70–99)
Potassium: 3.6 mmol/L (ref 3.5–5.1)
Sodium: 142 mmol/L (ref 135–145)
Total Bilirubin: 0.6 mg/dL (ref 0.3–1.2)
Total Protein: 6.6 g/dL (ref 6.5–8.1)
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
## 2022-08-23 ENCOUNTER — Other Ambulatory Visit: Payer: Self-pay | Admitting: *Deleted

## 2022-08-23 DIAGNOSIS — C50411 Malignant neoplasm of upper-outer quadrant of right female breast: Secondary | ICD-10-CM

## 2022-08-23 MED ORDER — TAMOXIFEN CITRATE 20 MG PO TABS: 20.0000 mg | ORAL_TABLET | Freq: Every day | ORAL | 4 refills | Status: DC

## 2022-08-23 NOTE — Telephone Encounter (Signed)
Patient called - requested refill of tamoxifen. Per MD OV note 01/03/22 - continue Tamoxifen, f/u in 1 year.  Confirmed with patient - scheduled w/appt with Dr. Chryl Heck 01/04/23

## 2022-12-26 ENCOUNTER — Other Ambulatory Visit: Payer: Self-pay | Admitting: *Deleted

## 2022-12-26 DIAGNOSIS — C50411 Malignant neoplasm of upper-outer quadrant of right female breast: Secondary | ICD-10-CM

## 2022-12-26 MED ORDER — TAMOXIFEN CITRATE 20 MG PO TABS: 20.0000 mg | ORAL_TABLET | Freq: Every day | ORAL | 4 refills | Status: DC

## 2023-01-03 ENCOUNTER — Other Ambulatory Visit: Payer: Self-pay | Admitting: *Deleted

## 2023-01-03 DIAGNOSIS — C50411 Malignant neoplasm of upper-outer quadrant of right female breast: Secondary | ICD-10-CM

## 2023-01-04 ENCOUNTER — Telehealth: Payer: Self-pay | Admitting: Hematology and Oncology

## 2023-01-04 ENCOUNTER — Inpatient Hospital Stay: Payer: Commercial Managed Care - HMO

## 2023-01-04 ENCOUNTER — Other Ambulatory Visit: Payer: Self-pay

## 2023-01-04 ENCOUNTER — Inpatient Hospital Stay: Payer: Commercial Managed Care - HMO | Attending: Hematology and Oncology | Admitting: Hematology and Oncology

## 2023-01-04 VITALS — BP 127/82 | HR 88 | Temp 97.9°F | Resp 16 | Ht 69.0 in | Wt 168.3 lb

## 2023-01-04 DIAGNOSIS — Z800 Family history of malignant neoplasm of digestive organs: Secondary | ICD-10-CM | POA: Diagnosis not present

## 2023-01-04 DIAGNOSIS — C50411 Malignant neoplasm of upper-outer quadrant of right female breast: Secondary | ICD-10-CM

## 2023-01-04 DIAGNOSIS — Z79810 Long term (current) use of selective estrogen receptor modulators (SERMs): Secondary | ICD-10-CM | POA: Insufficient documentation

## 2023-01-04 DIAGNOSIS — Z808 Family history of malignant neoplasm of other organs or systems: Secondary | ICD-10-CM | POA: Insufficient documentation

## 2023-01-04 DIAGNOSIS — Z923 Personal history of irradiation: Secondary | ICD-10-CM | POA: Insufficient documentation

## 2023-01-04 DIAGNOSIS — Z170 Estrogen receptor positive status [ER+]: Secondary | ICD-10-CM | POA: Diagnosis not present

## 2023-01-04 DIAGNOSIS — Z9011 Acquired absence of right breast and nipple: Secondary | ICD-10-CM | POA: Diagnosis not present

## 2023-01-04 LAB — CBC WITH DIFFERENTIAL (CANCER CENTER ONLY)
Abs Immature Granulocytes: 0.01 10*3/uL (ref 0.00–0.07)
Basophils Absolute: 0.1 10*3/uL (ref 0.0–0.1)
Basophils Relative: 1 %
Eosinophils Absolute: 0.1 10*3/uL (ref 0.0–0.5)
Eosinophils Relative: 2 %
HCT: 37.8 % (ref 36.0–46.0)
Hemoglobin: 13.1 g/dL (ref 12.0–15.0)
Immature Granulocytes: 0 %
Lymphocytes Relative: 39 %
Lymphs Abs: 1.6 10*3/uL (ref 0.7–4.0)
MCH: 32 pg (ref 26.0–34.0)
MCHC: 34.7 g/dL (ref 30.0–36.0)
MCV: 92.4 fL (ref 80.0–100.0)
Monocytes Absolute: 0.3 10*3/uL (ref 0.1–1.0)
Monocytes Relative: 6 %
Neutro Abs: 2.2 10*3/uL (ref 1.7–7.7)
Neutrophils Relative %: 52 %
Platelet Count: 208 10*3/uL (ref 150–400)
RBC: 4.09 MIL/uL (ref 3.87–5.11)
RDW: 11.9 % (ref 11.5–15.5)
WBC Count: 4.2 10*3/uL (ref 4.0–10.5)
nRBC: 0 % (ref 0.0–0.2)

## 2023-01-04 LAB — CMP (CANCER CENTER ONLY)
ALT: 11 U/L (ref 0–44)
AST: 18 U/L (ref 15–41)
Albumin: 4.3 g/dL (ref 3.5–5.0)
Alkaline Phosphatase: 63 U/L (ref 38–126)
Anion gap: 6 (ref 5–15)
BUN: 14 mg/dL (ref 6–20)
CO2: 30 mmol/L (ref 22–32)
Calcium: 9.4 mg/dL (ref 8.9–10.3)
Chloride: 106 mmol/L (ref 98–111)
Creatinine: 1.02 mg/dL — ABNORMAL HIGH (ref 0.44–1.00)
GFR, Estimated: >60 mL/min (ref >60)
Glucose, Bld: 105 mg/dL — ABNORMAL HIGH (ref 70–99)
Potassium: 3.8 mmol/L (ref 3.5–5.1)
Sodium: 142 mmol/L (ref 135–145)
Total Bilirubin: 0.6 mg/dL (ref 0.3–1.2)
Total Protein: 6.6 g/dL (ref 6.5–8.1)

## 2023-01-04 NOTE — Telephone Encounter (Signed)
Left patient a vm regarding upcoming appointment  
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
## 2023-01-06 LAB — FOLLICLE STIMULATING HORMONE: FSH: 37.4 m[IU]/mL

## 2023-01-06 LAB — ESTRADIOL: Estradiol: 8.3 pg/mL

## 2023-01-06 LAB — LUTEINIZING HORMONE: LH: 23.1 m[IU]/mL

## 2023-01-09 ENCOUNTER — Other Ambulatory Visit: Payer: Self-pay | Admitting: *Deleted

## 2023-01-09 ENCOUNTER — Ambulatory Visit
Admission: RE | Admit: 2023-01-09 | Discharge: 2023-01-09 | Disposition: A | Discharge status: Home / Self Care | Payer: Commercial Managed Care - HMO | Source: Ambulatory Visit | Attending: Hematology and Oncology | Admitting: Hematology and Oncology

## 2023-01-09 DIAGNOSIS — C50411 Malignant neoplasm of upper-outer quadrant of right female breast: Secondary | ICD-10-CM

## 2023-01-19 ENCOUNTER — Telehealth: Payer: Self-pay | Admitting: Hematology and Oncology

## 2023-01-19 ENCOUNTER — Telehealth: Payer: Self-pay | Admitting: *Deleted

## 2023-01-19 DIAGNOSIS — N951 Menopausal and female climacteric states: Secondary | ICD-10-CM

## 2023-01-19 DIAGNOSIS — Z853 Personal history of malignant neoplasm of breast: Secondary | ICD-10-CM

## 2023-01-19 NOTE — Telephone Encounter (Signed)
Spoke with patient confirming upcoming appointments  

## 2023-01-19 NOTE — Telephone Encounter (Addendum)
-----   Message from Rachel Moulds, MD sent at 01/08/2023  2:30 PM EDT ----- Pt wanted to know about menopausal status. It appears she may be post menopausal, but can't rely on one set of labs. May have to repeat it next time to confirm.  This RN discussed with pt - she is due to have her IUD removed and is wanting to verify reproductive status.  Request sent for labs in 6 months.

## 2023-06-20 ENCOUNTER — Other Ambulatory Visit: Payer: Self-pay

## 2023-06-20 DIAGNOSIS — C50411 Malignant neoplasm of upper-outer quadrant of right female breast: Secondary | ICD-10-CM

## 2023-06-21 ENCOUNTER — Telehealth: Payer: Self-pay | Admitting: *Deleted

## 2023-06-21 NOTE — Telephone Encounter (Signed)
This RN spoke with pt per her call stating she had labs done at her Gyn's showing she is premenopausal.  Lab appt for tomorrow will be canceled.

## 2023-06-22 ENCOUNTER — Inpatient Hospital Stay: Payer: Managed Care, Other (non HMO)

## 2023-07-26 ENCOUNTER — Ambulatory Visit (HOSPITAL_COMMUNITY): Payer: Managed Care, Other (non HMO)

## 2023-10-01 ENCOUNTER — Ambulatory Visit (HOSPITAL_COMMUNITY): Admission: RE | Admit: 2023-10-01 | Payer: Commercial Managed Care - HMO | Source: Ambulatory Visit

## 2023-10-01 ENCOUNTER — Encounter (HOSPITAL_COMMUNITY): Payer: Self-pay

## 2024-01-07 ENCOUNTER — Inpatient Hospital Stay: Payer: Commercial Managed Care - HMO | Attending: Hematology and Oncology | Admitting: Hematology and Oncology

## 2024-02-21 ENCOUNTER — Other Ambulatory Visit: Payer: Self-pay | Admitting: Obstetrics and Gynecology

## 2024-02-21 DIAGNOSIS — Z1231 Encounter for screening mammogram for malignant neoplasm of breast: Secondary | ICD-10-CM

## 2024-03-03 ENCOUNTER — Ambulatory Visit
Admission: RE | Admit: 2024-03-03 | Discharge: 2024-03-03 | Disposition: A | Discharge status: Home / Self Care | Source: Ambulatory Visit | Attending: Obstetrics and Gynecology | Admitting: Obstetrics and Gynecology

## 2024-03-03 DIAGNOSIS — Z1231 Encounter for screening mammogram for malignant neoplasm of breast: Secondary | ICD-10-CM

## 2024-03-29 ENCOUNTER — Other Ambulatory Visit: Payer: Self-pay | Admitting: Orthopedic Surgery

## 2024-07-14 ENCOUNTER — Encounter: Payer: Self-pay | Admitting: Radiology

## 2024-09-03 ENCOUNTER — Other Ambulatory Visit: Payer: Self-pay | Admitting: *Deleted

## 2024-09-03 DIAGNOSIS — C50411 Malignant neoplasm of upper-outer quadrant of right female breast: Secondary | ICD-10-CM

## 2024-09-03 MED ORDER — TAMOXIFEN CITRATE 20 MG PO TABS: 20.0000 mg | ORAL_TABLET | Freq: Every day | ORAL | 2 refills | Status: AC

## 2024-09-26 ENCOUNTER — Telehealth: Payer: Self-pay | Admitting: Hematology and Oncology

## 2024-09-26 NOTE — Telephone Encounter (Signed)
 I spoke with patient as she wanted to reschedule lab and MD from 09/30/2024 to 10/23/2024 due to insurance.

## 2024-09-30 ENCOUNTER — Inpatient Hospital Stay: Admitting: Hematology and Oncology

## 2024-10-23 ENCOUNTER — Inpatient Hospital Stay: Admitting: Hematology and Oncology

## 2024-10-23 ENCOUNTER — Inpatient Hospital Stay
